# Patient Record
Sex: Male | Born: 1980 | Race: White | Hispanic: No | Marital: Single
Health system: Midwestern US, Community
[De-identification: ages and names within clinical notes are randomized; demographics above are authoritative.]

## PROBLEM LIST (undated history)

## (undated) DIAGNOSIS — Z789 Other specified health status: Secondary | ICD-10-CM

## (undated) HISTORY — PX: ABDOMINAL SURGERY: SHX537

## (undated) HISTORY — PX: NO PAST SURGERIES: SHX2092

---

## 2001-04-11 ENCOUNTER — Inpatient Hospital Stay (HOSPITAL_COMMUNITY): Admission: AD | Admit: 2001-04-11 | Discharge: 2001-04-16 | Payer: Self-pay | Admitting: Psychiatry

## 2004-07-30 ENCOUNTER — Emergency Department (HOSPITAL_COMMUNITY): Admission: EM | Admit: 2004-07-30 | Discharge: 2004-07-30 | Payer: Self-pay | Admitting: Emergency Medicine

## 2007-09-18 ENCOUNTER — Emergency Department (HOSPITAL_COMMUNITY): Admission: EM | Admit: 2007-09-18 | Discharge: 2007-09-18 | Payer: Self-pay | Admitting: Emergency Medicine

## 2011-03-31 ENCOUNTER — Emergency Department (HOSPITAL_COMMUNITY)
Admission: EM | Admit: 2011-03-31 | Discharge: 2011-04-01 | Disposition: A | Discharge status: Home / Self Care | Payer: Medicare Other | Attending: Emergency Medicine | Admitting: Emergency Medicine

## 2011-03-31 DIAGNOSIS — F1910 Other psychoactive substance abuse, uncomplicated: Secondary | ICD-10-CM | POA: Insufficient documentation

## 2011-03-31 DIAGNOSIS — Z046 Encounter for general psychiatric examination, requested by authority: Secondary | ICD-10-CM | POA: Insufficient documentation

## 2011-03-31 LAB — RAPID URINE DRUG SCREEN, HOSP PERFORMED
Benzodiazepines: NOT DETECTED
Cocaine: POSITIVE — AB
Tetrahydrocannabinol: NOT DETECTED

## 2011-03-31 LAB — URINALYSIS, ROUTINE W REFLEX MICROSCOPIC
Nitrite: NEGATIVE
Protein, ur: NEGATIVE mg/dL
Specific Gravity, Urine: >1.03 — ABNORMAL HIGH (ref 1.005–1.030)
Urobilinogen, UA: 0.2 mg/dL (ref 0.0–1.0)

## 2011-04-01 LAB — BASIC METABOLIC PANEL
BUN: 19 mg/dL (ref 6–23)
Chloride: 104 mEq/L (ref 96–112)
GFR calc Af Amer: >60 mL/min (ref >60)
Potassium: 3.6 mEq/L (ref 3.5–5.1)

## 2011-04-01 LAB — DIFFERENTIAL
Basophils Absolute: 0 10*3/uL (ref 0.0–0.1)
Eosinophils Absolute: 0.2 10*3/uL (ref 0.0–0.7)
Eosinophils Relative: 3 % (ref 0–5)
Lymphocytes Relative: 37 % (ref 12–46)
Lymphs Abs: 2.5 10*3/uL (ref 0.7–4.0)
Monocytes Absolute: 0.5 10*3/uL (ref 0.1–1.0)
Monocytes Relative: 7 % (ref 3–12)
Neutrophils Relative %: 53 % (ref 43–77)

## 2011-04-01 LAB — CBC
MCH: 29.8 pg (ref 26.0–34.0)
RBC: 5.06 MIL/uL (ref 4.22–5.81)
RDW: 13.4 % (ref 11.5–15.5)
WBC: 6.7 10*3/uL (ref 4.0–10.5)

## 2011-04-01 LAB — ETHANOL: Alcohol, Ethyl (B): 41 mg/dL — ABNORMAL HIGH (ref 0–10)
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
## 2011-04-22 NOTE — H&P (Signed)
[facility]  Patient:    Victor [name], Victor [name]                      MRN: [id number] Adm. Date:  [id number] Attending:  Geoffery Lyons A Dictator:   Landry Corporal, N.P.                         History and Physical  IDENTIFYING DATA:   Patient was involuntarily committed on Apr 11, 2001, for bipolar disorder, currently experiencing manic symptoms.  REVIEW OF SYSTEMS:  Patient still having some thought disorganization.  Review of systems indicates patient seems to be overall in good health, with some loss of appetite.  Patient does state that he was exposed to poison oak and having some itching to his extremities, and he states he has some bad allergies.  The rest of the review of system is basically noncontributory.  VITAL SIGNS:  Patient states he is 5 feet 10 inches.  He is 185 pounds.  His last vital signs:  97.5, 81 heart rate, 20 respirations, blood pressure is 143/85.  GENERAL APPEARANCE:  Patient is a 30 year old  white male sitting on the exam table in no acute distress.  He is well developed.  Appears his stated age. He is alert and cooperative.  He is dressed in somewhat heavy attire thought.  HEAD:  Normocephalic, he can raise his eyebrows.  EYES:  His pupils are equal and reactive to light.  His EOMs are intact bilaterally.  His funduscopic exam is within normal limits.  His external ear canals are patent.  TMs are intact. His hearing is adequate for normal conversation.  No sinus tenderness.  No nasal discharge.  Mouth mucosa is moist, fair dentition, no lesions were seen. Tongue protrudes midline without tremor.  He can clench his teeth and puff out his cheeks.  There is no pharyngeal exudate.  NECK:  Supple, full range of motion.  No JVD.  Negative lymphadenopathy. Thyroid is nonpalpable and nontender.  Trachea is midline.  CHEST:  Clear to auscultation.  No adventitious sounds.  HEART:  Regular rate and rhythm.  No murmurs, gallops or rubs.   Carotid pulses are equal and adequate bilaterally.  No carotid bruits were auscultated.  ABDOMEN:  Soft, flat, nontender abdomen.  No masses or organomegaly.  Active bowel sounds are present.  There is no CVA tenderness.  MUSCULAR:  No joint swelling or deformity.  Gait is normal.  There is good range of motion.  Muscle strength and tone is equal bilaterally.  There appears to be no signs of any injury.  SKIN:  Warm and dry.  There is a mild, nonpalpable rash present to his wrists. Nail beds are pink with good capillary refill.  NEUROLOGIC:  He is oriented x 3.  His cranial nerves are grossly intact.  Deep tendon reflexes are difficult to elicit.  The patient has very heavy clothing on.  Good grip strength bilaterally.  No involuntary movements.  Gait is normal.  Cerebellar function is intact.  His Romberg is negative.  HEALTH MAINTENANCE issues were addressed. DD:  04/13/01 TD:  04/14/01 Job: [id number]
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
## 2011-10-05 ENCOUNTER — Encounter: Payer: Self-pay | Admitting: *Deleted

## 2011-10-05 DIAGNOSIS — R45850 Homicidal ideations: Secondary | ICD-10-CM | POA: Insufficient documentation

## 2011-10-05 DIAGNOSIS — F319 Bipolar disorder, unspecified: Secondary | ICD-10-CM | POA: Insufficient documentation

## 2011-10-05 LAB — DIFFERENTIAL
Lymphocytes Relative: 30 % (ref 12–46)
Lymphs Abs: 2.5 10*3/uL (ref 0.7–4.0)
Monocytes Relative: 7 % (ref 3–12)
Neutrophils Relative %: 61 % (ref 43–77)

## 2011-10-05 LAB — BASIC METABOLIC PANEL
CO2: 26 mEq/L (ref 19–32)
Calcium: 9.3 mg/dL (ref 8.4–10.5)
Creatinine, Ser: 0.75 mg/dL (ref 0.50–1.35)
GFR calc Af Amer: >90 mL/min (ref >90)
GFR calc non Af Amer: >90 mL/min (ref >90)
Glucose, Bld: 138 mg/dL — ABNORMAL HIGH (ref 70–99)
Sodium: 139 mEq/L (ref 135–145)

## 2011-10-05 LAB — CBC
HCT: 41.9 % (ref 39.0–52.0)
MCH: 29.7 pg (ref 26.0–34.0)
MCHC: 34.8 g/dL (ref 30.0–36.0)
MCV: 85.3 fL (ref 78.0–100.0)
Platelets: 230 10*3/uL (ref 150–400)
RBC: 4.91 MIL/uL (ref 4.22–5.81)

## 2011-10-05 LAB — ETHANOL: Alcohol, Ethyl (B): <11 mg/dL (ref 0–11)

## 2011-10-05 NOTE — ED Notes (Signed)
Pt arrived brought by [facility] [place] SD with IVC paperwork.  Per documentation pt is hearing voices and writing about killing Designer, television/film set.  Pt denies any of this. Alert and cooperative.  Family not available at present time.

## 2011-10-06 ENCOUNTER — Emergency Department (HOSPITAL_COMMUNITY)
Admission: EM | Admit: 2011-10-06 | Discharge: 2011-10-10 | Disposition: A | Discharge status: Other Acute Inpatient Hospital | Payer: Medicare Other | Attending: Emergency Medicine | Admitting: Emergency Medicine

## 2011-10-06 DIAGNOSIS — R45850 Homicidal ideations: Secondary | ICD-10-CM

## 2011-10-06 DIAGNOSIS — F319 Bipolar disorder, unspecified: Secondary | ICD-10-CM

## 2011-10-06 LAB — RAPID URINE DRUG SCREEN, HOSP PERFORMED
Amphetamines: NOT DETECTED
Cocaine: NOT DETECTED
Opiates: NOT DETECTED

## 2011-10-06 MED ORDER — NICOTINE 21 MG/24HR TD PT24: 21.0000 mg | MEDICATED_PATCH | Freq: Every day | TRANSDERMAL | Status: DC | PRN

## 2011-10-06 MED ORDER — ALUM & MAG HYDROXIDE-SIMETH 200-200-20 MG/5ML PO SUSP: 30.0000 mL | ORAL | Status: DC | PRN

## 2011-10-06 MED ORDER — ACETAMINOPHEN 325 MG PO TABS
650.0000 mg | ORAL_TABLET | ORAL | Status: DC | PRN
  Administered 2011-10-09: 650 mg via ORAL

## 2011-10-06 MED ORDER — ZOLPIDEM TARTRATE 5 MG PO TABS: 5.0000 mg | ORAL_TABLET | Freq: Every evening | ORAL | Status: DC | PRN

## 2011-10-06 MED ORDER — IBUPROFEN 400 MG PO TABS: 600.0000 mg | ORAL_TABLET | Freq: Three times a day (TID) | ORAL | Status: DC | PRN

## 2011-10-06 MED ORDER — LORAZEPAM 1 MG PO TABS
1.0000 mg | ORAL_TABLET | Freq: Three times a day (TID) | ORAL | Status: DC | PRN
  Administered 2011-10-06 – 2011-10-09 (×3): 1 mg via ORAL
  Filled 2011-10-06 (×2): qty 1

## 2011-10-06 MED ORDER — ONDANSETRON HCL 4 MG PO TABS: 4.0000 mg | ORAL_TABLET | Freq: Three times a day (TID) | ORAL | Status: DC | PRN

## 2011-10-06 MED ORDER — ZIPRASIDONE MESYLATE 20 MG IM SOLR
20.0000 mg | Freq: Two times a day (BID) | INTRAMUSCULAR | Status: DC | PRN
  Administered 2011-10-06: 20 mg via INTRAMUSCULAR
  Filled 2011-10-06: qty 20
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
## 2011-10-06 NOTE — ED Notes (Signed)
Patient taking shower.  Bedding changed while patient in shower.

## 2011-10-06 NOTE — ED Notes (Signed)
Pt states he needed his hand uncuffed from bed to give urine sample, officer uncuffed hand. Then pt stated he could not give a sample unless he was uncuffed from the bed to stand up. Pt informed that we had to collect urine sample before evaluation could be done. Pt refuses to give urine sample. rcsd remains sitting w/ pt.
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
## 2011-10-06 NOTE — ED Notes (Signed)
Patient offered shower. Police officer accompanied patient to shower and will remain with patient during shower. Patient in NAD.

## 2011-10-06 NOTE — ED Notes (Signed)
Breakfast tray provided to patient. Patient alert/oriented x 4. Sitting in bed watching TV. Law enforcement remains at bedside. Denies any other needs at this time.

## 2011-10-06 NOTE — ED Notes (Signed)
Security wanded pt for weapons.
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
## 2011-10-06 NOTE — ED Notes (Signed)
Patient stating he wants to draw tattoos. Patient provided with crayons and paper to draw. Patient denies any other needs. Law enforcement remains at bedside.
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
## 2011-10-07 NOTE — ED Notes (Signed)
Pt requesting release papers stating he has to get out of here because "I got stuff to do today."  Delay and plan of care explained to pt.  Pt verbalized understanding.  [employer] at bedside.

## 2011-10-07 NOTE — ED Notes (Signed)
Pt requesting to see Victor [name], ACT.  Ella notified and said will see pt as soon as she got her paperwork finished. Pt notified.

## 2011-10-07 NOTE — ED Notes (Signed)
Pt up to bathroom with [employer].  nad noted. 

## 2011-10-07 NOTE — ED Notes (Signed)
Pt lying in bed talking to deputy. No needs voiced at this time.
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
## 2011-10-07 NOTE — ED Notes (Signed)
Pt given a coke and peanut butter crackers.

## 2011-10-07 NOTE — ED Notes (Signed)
Pt watching tv in room in bed no noted distress no stated needs.

## 2011-10-07 NOTE — ED Notes (Signed)
Pt requesting to know who took out IVC papers and when he was going to leave.  Pt also requesting to speak to father on phone.  Status of pt placement explained to pt, pt verbalized understanding.  Pt allowed phone privileges to speak to dad providing pt remains calm during call.  Pt verbalized understanding.  Pt calm, cooperative, nad noted.  [employer] at bedside.

## 2011-10-07 NOTE — ED Notes (Signed)
Pt calm, cooperative, pleasant, ate 100% meal tray.  nad noted.  [employer] at bedside.

## 2011-10-07 NOTE — ED Notes (Signed)
Pt requesting to use the phone and wanted a warm blanket. Pt given a warm blanket and phone.

## 2011-10-07 NOTE — ED Notes (Signed)
Dinner tray given.  nad noted.  [employer] at bedside.

## 2011-10-07 NOTE — ED Notes (Signed)
Lunch tray given.  nad noted.  Pt pleasant, calm, cooperative, watching tv and talking to deputy.  nad noted.

## 2011-10-07 NOTE — ED Notes (Signed)
Pt calm, cooperative at this time.  Breakfast given.  [employer] at bedside.  nad noted.

## 2011-10-07 NOTE — ED Notes (Signed)
Pt calm, watching tv.  [employer] at bedside.  nad noted.

## 2011-10-08 NOTE — ED Provider Notes (Signed)
I have reevaluated the patient at this time and he is awake, alert, and oriented to person, place, time, and event, and in no distress, physically or behaviorally. He is calm, cooperative, and answers my questions appropriately. He denies any needs at this time other than some crackers which I've gotten for him. His breathing is nonlabored and he appears comfortable in bed. Awaiting placement per behavioral health team at this time.  Felisa Bonier, MD 10/08/11 2325

## 2011-10-08 NOTE — ED Notes (Signed)
Pt up to restroom with officer present outside door.

## 2011-10-08 NOTE — ED Notes (Signed)
Pts linens changed 

## 2011-10-08 NOTE — ED Notes (Signed)
Pt is resting. NAD. Remains calm and cooperative. [employer] at bedside.

## 2011-10-08 NOTE — ED Notes (Signed)
Pt asleep, [employer] at bedside

## 2011-10-08 NOTE — ED Notes (Signed)
Pt provided snack. Calm and cooperative at this time. Officer remains at bedside, pt is shackled to bed frame. No further requests at this time.

## 2011-10-08 NOTE — ED Notes (Signed)
Pt accepted to [facility] wait list. Sending a fax documenting pt's aggressive behavior since he has been at this facility. Sending fax to Markus Jarvis [phone].

## 2011-10-08 NOTE — ED Notes (Signed)
Pt took a Technical brewer and staff present. Pt was given new paper scrubs and socks to put on. Pt was cooperative in the shower. He give officer and staff no problems. Pt's linens were changed and pt was given 2 new warm blankets. Pt was also given his lunch tray. Pt presently in shackles, but no handcuffs. NAD at this time.

## 2011-10-08 NOTE — ED Notes (Signed)
Pt requesting to shower. Pt and rcsd taken to shower. Pt given new scrubs, socks,  toothbrush, toothpaste, soap, deodorant, shampoo, towels and washcloths.

## 2011-10-08 NOTE — ED Notes (Signed)
Pt has had meal. [employer] at bedside. Pt has remained calm and cooperative.

## 2011-10-08 NOTE — ED Notes (Signed)
Pt watching television. [employer] remains at bedside. PT has had shower. NAD at this time.

## 2011-10-08 NOTE — ED Notes (Signed)
Pt resting with eyes closed.

## 2011-10-08 NOTE — ED Notes (Signed)
Pt provided crackers & peanut butter at this time. rcsd remains in room w/ pt no other needs voiced at this time.

## 2011-10-09 NOTE — ED Notes (Signed)
Pt resting in bed, even rise and fall of chest, in NAD. Officer remains at bedside. Will continue to monitor. 

## 2011-10-09 NOTE — ED Notes (Signed)
Pt resting in bed, [employer]d remains at bedside.  Offered foods/bathroom, pt declined.

## 2011-10-09 NOTE — ED Notes (Signed)
Pt requested med to help relax, lorazepam given. Officer remains at bedside.

## 2011-10-09 NOTE — ED Notes (Signed)
Pt watching tv in bed with [employer] present at bedside. No requests at this time.

## 2011-10-09 NOTE — ED Provider Notes (Signed)
Pt stable [facility]  Benny Lennert, MD 10/09/11 [phone]

## 2011-10-09 NOTE — ED Provider Notes (Signed)
Pt awake and alert, intently watching tv. Under ivc, security at bedside, awaiting [facility] bed. Vitals normal.   Suzi Roots, MD 10/09/11 2329

## 2011-10-09 NOTE — ED Notes (Signed)
Pt was given a breakfast tray. [employer] present at bedside. Pt is not handcuffed or shackled at this time. NAD noted.

## 2011-10-10 NOTE — ED Notes (Signed)
Pt resting in bed watching TV, No requests at this time.

## 2011-10-10 NOTE — ED Notes (Signed)
Pt remains calm and cooperative. Pt resting in bed, even rise and fall of chest, in NAD. Officer remains at bedside. Will continue to monitor.

## 2011-10-10 NOTE — ED Notes (Signed)
Pt resting in bed, even rise and fall of chest, in NAD. Officer remains at bedside. Will continue to monitor. 

## 2011-10-10 NOTE — ED Notes (Signed)
Pt resting with eyes closed, officer at bedside, breathing even and unlabored, NAD. Will continue to monitor.

## 2011-10-10 NOTE — ED Notes (Signed)
Report to greg oncoming rn 

## 2011-10-10 NOTE — ED Notes (Signed)
Pt has a visitor. Visitor brought the pt a soft cover book to read. Officer remains outside door at this time. Pt remains calm and cooperative. Will continue to monitor visitation.

## 2011-10-10 NOTE — ED Notes (Signed)
Pt asleep in bed, resp even, officer remains at bedside.

## 2011-10-10 NOTE — ED Notes (Signed)
Pt resting in bed, rcsd at bedside
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
## 2011-10-10 NOTE — ED Notes (Signed)
Pt resting watching TV, officer remains at bedside, in NAD. Will continue to monitor.

## 2012-02-22 ENCOUNTER — Emergency Department (HOSPITAL_COMMUNITY)
Admission: EM | Admit: 2012-02-22 | Discharge: 2012-02-22 | Disposition: A | Discharge status: Home / Self Care | Payer: Medicare Other | Attending: Emergency Medicine | Admitting: Emergency Medicine

## 2012-02-22 ENCOUNTER — Encounter (HOSPITAL_COMMUNITY): Payer: Self-pay

## 2012-02-22 DIAGNOSIS — M6282 Rhabdomyolysis: Secondary | ICD-10-CM | POA: Insufficient documentation

## 2012-02-22 DIAGNOSIS — E860 Dehydration: Secondary | ICD-10-CM | POA: Insufficient documentation

## 2012-02-22 LAB — BASIC METABOLIC PANEL
Chloride: 99 mEq/L (ref 96–112)
GFR calc Af Amer: >90 mL/min (ref >90)
Potassium: 3.8 mEq/L (ref 3.5–5.1)
Sodium: 136 mEq/L (ref 135–145)

## 2012-02-22 LAB — URINALYSIS, ROUTINE W REFLEX MICROSCOPIC
Ketones, ur: 15 mg/dL — AB
Leukocytes, UA: NEGATIVE
Nitrite: NEGATIVE
Specific Gravity, Urine: >1.03 — ABNORMAL HIGH (ref 1.005–1.030)
pH: 5.5 (ref 5.0–8.0)

## 2012-02-22 LAB — CK: Total CK: 1414 U/L — ABNORMAL HIGH (ref 7–232)

## 2012-02-22 MED ORDER — KETOROLAC TROMETHAMINE 60 MG/2ML IM SOLN: 60.0000 mg | Freq: Once | INTRAMUSCULAR | Status: DC

## 2012-02-22 MED ORDER — SODIUM CHLORIDE 0.9 % IV BOLUS (SEPSIS): 1000.0000 mL | Freq: Once | INTRAVENOUS | Status: DC

## 2012-02-22 MED ORDER — PROMETHAZINE HCL 25 MG PO TABS: 25.0000 mg | ORAL_TABLET | Freq: Four times a day (QID) | ORAL | Status: AC | PRN

## 2012-02-22 NOTE — ED Notes (Signed)
IV started, labs drawn. Pt declines pain medication at this time.

## 2012-02-22 NOTE — ED Provider Notes (Signed)
History     CSN: [id number]  Arrival date & time 02/22/12  0204   First MD Initiated Contact with Patient 02/22/12 0240      Chief Complaint  Patient presents with  . Dehydration    (Consider location/radiation/quality/duration/timing/severity/associated sxs/prior treatment) HPI Comments: 31 year old male presents with a complaint of cramping all over, thigh cramping, back cramping. He states that this is persistent over the last several days, gradually worsening, not associated with diarrhea though he does have some nausea this evening. He states that he works outside as a Scientist, water quality and has been doing a lot of jogging in the last 24 hours. He is unable to tolerate fluids by mouth. He does have associated headache. These are persistent  The history is provided by the patient and a relative.    History reviewed. No pertinent past medical history.  History reviewed. No pertinent past surgical history.  No family history on file.  History  Substance Use Topics  . Smoking status: Never Smoker   . Smokeless tobacco: Not on file  . Alcohol Use: No      Review of Systems  Constitutional: Negative for fever and chills.  HENT: Negative for sore throat and neck pain.   Eyes: Negative for visual disturbance.  Respiratory: Negative for cough and shortness of breath.   Cardiovascular: Negative for chest pain.  Gastrointestinal: Positive for nausea. Negative for vomiting, abdominal pain and diarrhea.  Genitourinary: Negative for dysuria and frequency.  Musculoskeletal: Positive for myalgias and back pain. Negative for joint swelling and gait problem.  Skin: Negative for rash.  Neurological: Positive for headaches. Negative for weakness and numbness.  Hematological: Negative for adenopathy.  Psychiatric/Behavioral: Negative for behavioral problems.    Allergies  Review of patient's allergies indicates no known allergies.  Home Medications   Current Outpatient Rx  Name Route  Sig Dispense Refill  . PROMETHAZINE HCL 25 MG PO TABS Oral Take 1 tablet (25 mg total) by mouth every 6 (six) hours as needed for nausea. 12 tablet 0    BP 145/91  Pulse 81  Temp(Src) 98.1 F (36.7 C) (Oral)  Resp 18  Ht 5\' 9"  (1.753 m)  Wt 200 lb (90.719 kg)  BMI 29.53 kg/m2  SpO2 100%  Physical Exam  Nursing note and vitals reviewed. Constitutional: He appears well-developed and well-nourished. No distress.  HENT:  Head: Normocephalic and atraumatic.  Mouth/Throat: Oropharynx is clear and moist. No oropharyngeal exudate.  Eyes: Conjunctivae and EOM are normal. Pupils are equal, round, and reactive to light. Right eye exhibits no discharge. Left eye exhibits no discharge. No scleral icterus.  Neck: Normal range of motion. Neck supple. No JVD present. No thyromegaly present.  Cardiovascular: Normal rate, regular rhythm, normal heart sounds and intact distal pulses.  Exam reveals no gallop and no friction rub.   No murmur heard. Pulmonary/Chest: Effort normal and breath sounds normal. No respiratory distress. He has no wheezes. He has no rales.  Abdominal: Soft. Bowel sounds are normal. He exhibits no distension and no mass. There is no tenderness.  Musculoskeletal: Normal range of motion. He exhibits tenderness ( Mild tenderness in the bilateral thigh muscles, lower back. No swelling or edema). He exhibits no edema.  Lymphadenopathy:    He has no cervical adenopathy.  Neurological: He is alert. Coordination normal.  Skin: Skin is warm and dry. No rash noted. No erythema.  Psychiatric: He has a normal mood and affect. His behavior is normal.    ED Course  Procedures (including critical care time)  Labs Reviewed  CK - Abnormal; Notable for the following:    Total CK 1414 (*)    All other components within normal limits  BASIC METABOLIC PANEL - Abnormal; Notable for the following:    Glucose, Bld 114 (*)    BUN 31 (*)    All other components within normal limits  URINALYSIS,  ROUTINE W REFLEX MICROSCOPIC - Abnormal; Notable for the following:    Specific Gravity, Urine >1.030 (*)    Ketones, ur 15 (*)    All other components within normal limits   No results found.   1. Rhabdomyolysis   2. Dehydration       MDM  If his membranes moist, vital signs normal, has tenderness in his muscles. Rule out rhabdomyolysis, renal dysfunction dehydration and urinary infection. IV fluid bolus, intra-venous Toradol   Laboratory results reviewed and show that he has a creatinine kinase of 1400, normal renal function and a urinalysis that shows dehydration with high specific gravity and 15 ketones. He has been given 2 L of IV fluids, is able to take oral fluids and is amenable to discharge. I have encouraged him to followup closely for a recheck of his creatine kinase, if needed to come to the emergency department for this. He has expressed his understanding      Vida Roller, MD 02/22/12 [phone]

## 2012-02-22 NOTE — ED Notes (Signed)
Pt reports that he is feeling dehydrated.  States he is having generalized cramps and some numbness.  Pt denies n/v/d.  Denies problems with tolerating p.o fluids.

## 2012-02-22 NOTE — Discharge Instructions (Signed)
You must return to the ER for a recheck of your blood test called "CK" in the next 1-2 days if you can't be seen by your family doctor for a recheck.  Return to ER for severe or worsening pain, vomiting, soreness, fevers.  RESOURCE GUIDE  Dental Problems  Patients with Medicaid: [facility] [phone] [address]                                           [phone] [address] Phone:  [phone]                                                  Phone:  [phone]  If unable to pay or uninsured, contact:  [facility] or [facility] to become qualified for the adult dental clinic.  Chronic Pain Problems Contact [facility]  [phone] Patients need to be referred by their primary care doctor.  Insufficient Money for Medicine Contact United Way:  call "211" or [facility] [phone].  No Primary Care Doctor Call [facility]  [phone] Other agencies that provide inexpensive medical care    [facility]  [phone]    [facility]  [phone]    [facility]  [phone]    [facility]  [phone]    [facility]  [phone]    [facility]  [phone]  Psychological Services [facility]  [phone] [facility] Services  [phone] [facility]   [phone] (emergency services [phone])  Substance Abuse Resources [facility]  [phone] [facility] [phone] [facility] [phone] Floydene Flock [phone] Residential & Outpatient Substance Abuse Program  [phone]  Abuse/Neglect [facility] Child Abuse Hotline 445-040-7072 [facility] Child Abuse Hotline (208) 533-8063 (After Hours)  Emergency Shelter [facility] [phone]  Maternity Homes [facility] [phone] [facility] [phone]  MRSA Hotline  #:   352-512-8167    [facility] Resources  [facility]     [employer]                          [facility] Dept. [address] [place]                       [address]      [address]  [place]                                                Cristobal Goldmann Phone:  [phone]  Phone:  [phone]                 Phone:  [phone]  [place] [facility] Phone:  [phone]  [place] Child Abuse Hotline (336) 342-1394 [phone]) [phone] (After Hours)   

## 2012-02-22 NOTE — ED Notes (Signed)
Pt states he feels that he is dehydrated, states is cramping all over, denies n/v/d

## 2012-05-05 ENCOUNTER — Emergency Department (HOSPITAL_COMMUNITY)
Admission: EM | Admit: 2012-05-05 | Discharge: 2012-05-05 | Discharge status: Against Medical Advice | Payer: Medicare Other | Attending: Emergency Medicine | Admitting: Emergency Medicine

## 2012-05-05 ENCOUNTER — Encounter (HOSPITAL_COMMUNITY): Payer: Self-pay | Admitting: Emergency Medicine

## 2012-05-05 DIAGNOSIS — Z5321 Procedure and treatment not carried out due to patient leaving prior to being seen by health care provider: Secondary | ICD-10-CM

## 2012-05-05 DIAGNOSIS — Z0389 Encounter for observation for other suspected diseases and conditions ruled out: Secondary | ICD-10-CM | POA: Insufficient documentation
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
## 2012-05-05 NOTE — ED Notes (Signed)
Pt c/o chest congestion with productive cough.

## 2012-05-05 NOTE — ED Provider Notes (Cosign Needed)
History     CSN: [id number]  Arrival date & time 05/05/12  1158   First MD Initiated Contact with Patient 05/05/12 1248      Chief Complaint  Patient presents with  . Shortness of Breath    (Consider location/radiation/quality/duration/timing/severity/associated sxs/prior treatment) HPI  History reviewed. No pertinent past medical history.  History reviewed. No pertinent past surgical history.  History reviewed. No pertinent family history.  History  Substance Use Topics  . Smoking status: Never Smoker   . Smokeless tobacco: Not on file  . Alcohol Use: Yes      Review of Systems  Allergies  Review of patient's allergies indicates no known allergies.  Home Medications  No current outpatient prescriptions on file.  BP 121/69  Pulse 89  Temp(Src) 98.5 F (36.9 C) (Oral)  Resp 20  Ht 5\' 9"  (1.753 m)  Wt 195 lb (88.451 kg)  BMI 28.80 kg/m2  SpO2 100%  Physical Exam  ED Course  Procedures (including critical care time)  2:12 PM Pt left without being seen.  1. Patient left without being seen           Carleene Cooper III, MD 05/05/12 1414
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
## 2012-06-02 ENCOUNTER — Emergency Department (HOSPITAL_COMMUNITY)
Admission: EM | Admit: 2012-06-02 | Discharge: 2012-06-02 | Disposition: A | Discharge status: Home / Self Care | Payer: No Typology Code available for payment source | Attending: Emergency Medicine | Admitting: Emergency Medicine

## 2012-06-02 ENCOUNTER — Emergency Department (HOSPITAL_COMMUNITY): Payer: No Typology Code available for payment source

## 2012-06-02 ENCOUNTER — Encounter (HOSPITAL_COMMUNITY): Payer: Self-pay

## 2012-06-02 DIAGNOSIS — M542 Cervicalgia: Secondary | ICD-10-CM | POA: Insufficient documentation

## 2012-06-02 DIAGNOSIS — Y9389 Activity, other specified: Secondary | ICD-10-CM | POA: Insufficient documentation

## 2012-06-02 DIAGNOSIS — S161XXA Strain of muscle, fascia and tendon at neck level, initial encounter: Secondary | ICD-10-CM

## 2012-06-02 DIAGNOSIS — Y92410 Unspecified street and highway as the place of occurrence of the external cause: Secondary | ICD-10-CM | POA: Insufficient documentation

## 2012-06-02 DIAGNOSIS — Y999 Unspecified external cause status: Secondary | ICD-10-CM | POA: Insufficient documentation

## 2012-06-02 DIAGNOSIS — T148XXA Other injury of unspecified body region, initial encounter: Secondary | ICD-10-CM

## 2012-06-02 DIAGNOSIS — IMO0002 Reserved for concepts with insufficient information to code with codable children: Secondary | ICD-10-CM | POA: Insufficient documentation

## 2012-06-02 MED ORDER — HYDROCODONE-ACETAMINOPHEN 5-325 MG PO TABS
2.0000 | ORAL_TABLET | Freq: Once | ORAL | Status: DC
  Filled 2012-06-02: qty 2

## 2012-06-02 MED ORDER — IBUPROFEN 600 MG PO TABS: 600.0000 mg | ORAL_TABLET | Freq: Three times a day (TID) | ORAL | Status: AC | PRN

## 2012-06-02 MED ORDER — TETANUS-DIPHTH-ACELL PERTUSSIS 5-2.5-18.5 LF-MCG/0.5 IM SUSP
0.5000 mL | Freq: Once | INTRAMUSCULAR | Status: DC
  Filled 2012-06-02: qty 0.5
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
## 2012-06-02 NOTE — ED Notes (Signed)
c-collar placed in triage

## 2012-06-02 NOTE — ED Notes (Signed)
Pt refused Tetanus shot.  °
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
## 2012-06-02 NOTE — ED Notes (Addendum)
Pt refusing hydrocodone stating, " I dont want that. Just tell him to give me a THC pill. That will take care of my pain"

## 2014-01-19 ENCOUNTER — Inpatient Hospital Stay
Admit: 2014-01-19 | Discharge: 2014-01-21 | Disposition: A | Discharge status: Home / Self Care | Payer: MEDICARE | Attending: Forensic Psychiatry | Admitting: Forensic Psychiatry

## 2014-01-19 DIAGNOSIS — F3112 Bipolar disorder, current episode manic without psychotic features, moderate: Secondary | ICD-10-CM

## 2014-01-19 MED ORDER — LORAZEPAM 1 MG TAB
1 mg | ORAL | Status: AC
Start: 2014-01-19 — End: 2014-01-19
  Administered 2014-01-19: 07:00:00 via ORAL

## 2014-01-19 MED ORDER — HALOPERIDOL 5 MG TAB
5 mg | ORAL | Status: AC
Start: 2014-01-19 — End: 2014-01-19
  Administered 2014-01-19: 07:00:00 via ORAL

## 2014-01-19 MED ADMIN — LORazepam (ATIVAN) tablet 2 mg: ORAL | @ 17:00:00 | NDC 63739050010

## 2014-01-19 MED ADMIN — haloperidol (HALDOL) tablet 5 mg: ORAL | @ 17:00:00 | NDC 00781139613

## 2014-01-19 MED FILL — HALOPERIDOL 5 MG TAB: 5 mg | ORAL | Qty: 1

## 2014-01-19 MED FILL — HYDROXYZINE 50 MG TAB: 50 mg | ORAL | Qty: 1

## 2014-01-19 MED FILL — LORAZEPAM 1 MG TAB: 1 mg | ORAL | Qty: 2

## 2014-01-19 NOTE — ED Notes (Signed)
Security present for 1:1 observation. Pt offers no complaints, pt aggravated that he was sent here for eval. Pt maintains that he has done nothing wrong, that he's threatened no one, and is not a danger to himself or anyone else

## 2014-01-19 NOTE — Behavioral Health Treatment Team (Signed)
Pt did not take dinner, he has been self isolating in his room, no further outbursts. Chilton GreathouseKathleen Guerra, RN

## 2014-01-19 NOTE — Behavioral Health Treatment Team (Signed)
Pt requesting something more for sleep, given Seroquel 100mg , this writer had offered him Ativan, due to his tense appearance, he said "fuck the ativan I want the seroquel!"  He was polite after he was given the PRN medication intervention.

## 2014-01-19 NOTE — ED Notes (Signed)
Pt to be admitted to BHU

## 2014-01-19 NOTE — ED Notes (Signed)
Pt medicated as ordered, pt tolerated well

## 2014-01-19 NOTE — Behavioral Health Treatment Team (Signed)
Lori Regan, RN Registered Nurse Signed  BH Notes 01/19/14 0638      Expand All Collapse All   SHIFT CHANGE REPORT    Verbal Shift report given to: Kathy Guerra, RN and Pat Kowalczyk, RN    ,   using SBAR, Doc Flowsheets, Kardex, MAR and Notes.    By: Lori Regan, RN

## 2014-01-19 NOTE — Behavioral Health Treatment Team (Signed)
SHIFT CHANGE REPORT    Verbal Shift report given to:   C. Bliefernich RN and J. Blume, RN  By: Kathleen Guerra, RN

## 2014-01-19 NOTE — ED Notes (Signed)
Pt aware of admission

## 2014-01-19 NOTE — Behavioral Health Treatment Team (Addendum)
Patient very paranoid,  Does not want to answer questions.   Uses foul words, especially racial  Labile, irritable and angry,  Gets threatening verbal,  After a short interview he left the room, pushing the chair towards the table very hard.  Fortunately he has been willing to take PO PRN med's, Haldol and Ativan, He did not want any regular med's.  There is information from his family in [place] about his background information.  Has chronic schizophrenia with potential to act out. Has shown poor med compliance  Present very poor insight and judgment.  Admitted as 9.39 case.  Needs continued inpatient treatment.  9.39 update done.
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
## 2014-01-19 NOTE — ED Notes (Signed)
SW Jess at the bedside

## 2014-01-19 NOTE — Behavioral Health Treatment Team (Signed)
Pt met with Dr. Larena Sox, MHT and this writer to gather information regarding events leading to hospitalization. He presents with mood lability and irritability. Foul, derogatory language toward team, no insight into illness. He does not belong here, and should be let go. When asked about prior MH hospitalizations he said what does that have to do with anything, can't a person move and go elsewhere and start over?  Poor historian, easily agitated. Left meeting abruptly, banging door into chair.   Offered prn by med nurse, said yes than changed his mind saying the medication "Fu--s me up". MHT Barnabas Lister attempting to establish trusting rapport with pt. Continue with supportive therapies. Baxter Hire, RN
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
## 2014-01-19 NOTE — H&P (Signed)
This 33 yr old white single male is from [place].  It appears that he had been on the move and lately he was in [place] , working as a Corporate investment bankerconstruction worker.  His boss called [place] police when Vinetta BergamoCharley was reportedly getting upset with him    He was noted to be very angry, bizarre, and very delusional upon intake. He was making racial comments using four letter words often.    There is some information on his background , the salient notes going back to 2000. 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  He has shown explosive behaviors, verbal and physical aggression.   He has travelled and been to different psyche hospital , involuntarily committed.   Family is supportive and involved to get him some help.  Urine positive for marijuana.It seems he has no intent to harm self but if not in a structured environment, without med's  he could very well be a danger to others,    He does not want to tal He gets very angry and wants to leave.   Pleas e my progress note from today's direct contact with him.  He does not want to med. Fortunately will take PRN by mouth, with some persuasion.    Dx  Axis ! Schizoaffective Disorder , Bipolar Type.  Axis 11 Deferred,  Axis 111 NKA  Denies medical issues  Axis !V  Poor insight  And judgement. Poor treatment compliance.  Axis  V  Current GAF 20.    Additional information could be obtained from his family.  Fortunately he is taking PRN med's which are Haldol for paranoia, and Ativan for anxiety and [facility] and he was medically cleared before [facility]

## 2014-01-19 NOTE — ED Provider Notes (Signed)
Patient is a 33 y.o. male presenting with mental health disorder. The history is provided by the patient and medical records.   Mental Health Problem  The primary symptoms do not include dysphoric mood.   Additional symptoms of the illness do not include no agitation.    Patient was transferred from [facility] hospital.  As per report, patient has history of schizophrenia, not on meds, acting bizarre and threatening co-workers.  Patient denies any history of schizophrenia, bipolar, or other mental illness.  Patient does report problem with crack cocaine in the past, denies current use.  As per patient, he is working on a Holiday representative site, came up from [employer] [place] to earn some money and is trying to earn enough to bring his girlfriend from [place] up here.    Patient reports that he had some sort of disagreement with his boss or co-workers which resulted in police being called.  Patient believes that his boss or co-worker called police.  Police report [facility] by [facility] indicates that patient's mother called police and reported history of bipolar and schizophrenia.  When asked, patient states he's not sure why his mother would say that, but that he wishes that his parents would just die of natural causes so they will leave him alone.  Patient denies assaulting anyone, denies suicidal/homicidal thoughts.  States he just wants to be left alone.        History reviewed. No pertinent past medical history.     History reviewed. No pertinent past surgical history.      History reviewed. No pertinent family history.     History     Social History   ??? Marital Status: SINGLE     Spouse Name: N/A     Number of Children: N/A   ??? Years of Education: N/A     Occupational History   ??? Not on file.     Social History Main Topics   ??? Smoking status: Current Every Day Smoker   ??? Smokeless tobacco: Not on file   ??? Alcohol Use: Yes   ??? Drug Use: Yes     Special: Marijuana   ??? Sexual Activity: Not on file     Other Topics Concern   ??? Not  on file     Social History Narrative   ??? No narrative on file                  ALLERGIES: Review of patient's allergies indicates no known allergies.      Review of Systems   Constitutional: Negative for fever and chills.   Respiratory: Negative.    Cardiovascular: Negative.    Gastrointestinal: Negative.    Neurological: Negative for dizziness and light-headedness.   Psychiatric/Behavioral: Negative for suicidal ideas, sleep disturbance, self-injury, dysphoric mood and agitation. The patient is not nervous/anxious.    All other systems reviewed and are negative.      Filed Vitals:    01/19/14 0023   BP: 132/74   Pulse: 76   Temp: 98.1 ??F (36.7 ??C)   Resp: 20   Height: 5\' 9"  (1.753 m)   Weight: 78.472 kg (173 lb)   SpO2: 100%            Physical Exam   Constitutional: He is oriented to person, place, and time. He appears well-developed and well-nourished.   HENT:   Head: Normocephalic and atraumatic.   Mouth/Throat: Oropharynx is clear and moist.   Eyes: Conjunctivae and EOM are normal.  Pupils are equal, round, and reactive to light.   Neck: Normal range of motion. Neck supple.   Cardiovascular: Normal rate, regular rhythm and normal heart sounds.  Exam reveals no gallop and no friction rub.    No murmur heard.  Pulmonary/Chest: Effort normal and breath sounds normal. No respiratory distress. He has no wheezes. He has no rales.   Abdominal: Soft. He exhibits no distension and no mass. There is no tenderness. There is no rebound and no guarding.   Musculoskeletal: Normal range of motion. He exhibits no edema or tenderness.   Neurological: He is alert and oriented to person, place, and time. He has normal strength. He displays no tremor. Coordination and gait normal.   Skin: Skin is warm and dry. No rash noted.   Psychiatric: He has a normal mood and affect. His speech is normal and behavior is normal. Judgment normal. He is not actively hallucinating. Thought content is not paranoid. He expresses no homicidal and no  suicidal ideation.   Patient exhibits tangential thoughts, but no flight of ideas.       Nursing note and vitals reviewed.       MDM    Procedures      [facility], which include CBC, BMP, LFTs, UA - all normal.  TSH is slightly low.  EtOH is negative.  UDS shows positive only for cannabinoids.  Information provided by patient contradicts information from police and ED.  Patient's speech is not "incoherent" or "illogical" and patient does speak clearly.  He is not "argumentative" or "belligerent" here.  Patient does not have flight of ideas at this time.    The patient's medical screening exam is complete and the patient is medically clear for evaluation by mental health/detox screener.    1:31 AM  Patient seen by psych screener.  She spoke with patient's mom on the phone and received further information - mom sent a 7 page history describing patient's prior psych history.  Apparently, patient's friend who works at [employer] construction site had called the mother to alert her about patient's strange behavior there.  Apparently, patient has had 6 prior major psychotic episodes, most of which resulted in psychiatric hospitalization.  Mother's history details all the institutions where he was previously hospitalized and names of treating doctors.    When interviewed by psych screener, patient became very aggressive and belligerent.  Calmed down when she left the room.    Given additional information and history of psychosis with no current medication, I would suggest involuntary detention for observation at this time.    Screener spoke with Dr. Donah DriverSalgunan who agrees to psych admit.    <EMERGENCY DEPARTMENT CASE SUMMARY>    Plan: Admit to psych    ED Course: as noted above    Final Impression/Diagnosis: Psychosis, NOS.      Patient condition at time of disposition: stable      I have reviewed the following home medications:    Prior to Admission medications    Not on File         Leitha SchullerMatthew Yaqub Arney, MD
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
## 2014-01-19 NOTE — Other (Signed)
TRANSFER - OUT REPORT:    Telephone report given to Jacki ConesLaurie, RN(name) on Terry KilCharley Hartman  being transferred to MHU(unit) for routine progression of care       Report consisted of patient???s Situation, Background, Assessment and   Recommendations(SBAR).     Information from the following report(s) SBAR, ED Summary, [facility] and Recent Results was reviewed with the receiving nurse.    Opportunity for questions and clarification was provided.

## 2014-01-19 NOTE — Behavioral Health Treatment Team (Signed)
Patient isolative in room, remaining in bed . Requested and received haldol 5 mg po and vistaril 50 mg po @ 2154 for insomnia. Pt had no verbal outbursts at this time and returned to his room and is currently sleeping.

## 2014-01-20 MED ADMIN — haloperidol (HALDOL) tablet 5 mg: ORAL | @ 03:00:00 | NDC 00781139613

## 2014-01-20 MED ADMIN — hydrOXYzine (ATARAX) tablet 50 mg: ORAL | @ 03:00:00 | NDC 51079081601

## 2014-01-20 MED ADMIN — QUEtiapine (SEROquel) tablet 100 mg: ORAL | @ 05:00:00 | NDC 00904627961

## 2014-01-20 MED FILL — HALOPERIDOL 5 MG TAB: 5 mg | ORAL | Qty: 1

## 2014-01-20 MED FILL — HYDROXYZINE 50 MG TAB: 50 mg | ORAL | Qty: 1

## 2014-01-20 MED FILL — QUETIAPINE 100 MG TAB: 100 mg | ORAL | Qty: 1
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
## 2014-01-20 NOTE — Behavioral Health Treatment Team (Signed)
SHIFT CHANGE REPORT    Verbal Shift report given to: Brian O'Donohue, RN, Scott Ryerson, RN  using SBAR, Doc Flowsheets, Kardex, MAR and Notes.  Time for questioning given    By: Jeanne Blume, RN

## 2014-01-21 MED ORDER — QUETIAPINE 50 MG TAB
50 mg | ORAL_TABLET | Freq: Every evening | ORAL | Status: AC | PRN
Start: 2014-01-21 — End: 2014-02-20

## 2014-01-21 MED ORDER — QUETIAPINE 50 MG TAB
50 mg | Freq: Every evening | ORAL | Status: DC | PRN
Start: 2014-01-21 — End: 2014-01-21
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
## 2014-01-21 NOTE — Other (Signed)
Terry Hartman  [address]  [place] [address]  DOB: May 02, 1981  SSN: [id number]  [employer] A + B: [id number]    Emergency Contact: Renard Hamper [phone]    Reason for Admission: Was not completed due to pt being discharged.     Previous Psychiatric Hospitalizations:     HISTORY OF OUTPATIENT PSYCHIATRIC TREATMENT:  FACILITY DATES CONTACT PERSON / PHONE                              HISTORY OF ABUSE / TRAUMA (Include any treatment history):              Sexual:      Physical:      Emotional:     Other:        FAMILY OF ORIGIN:    HISTORY OF RELATIONSHIPS (Dates of Marriage / Divorce, including domestic partner relationships):     ________________________________________________________________________  Children: Name of Child Age Sex With whom does s/he live? Any CPS custody / involvement  ie: visitation, supervision, etc.                                  DEVELOPMENT / CHILDHOOD EVENTS:            NORMAL DEVELOPMENTAL MILESTONES (yes/no):        EDUCATION: (put "x" next to appropriate answer):     Grade School (Grade:    High School (Grade:               Diploma           GED       College  (Degree: None / Chief Operating Officer / Masters / Corporate investment banker       Other:     MILITARY HISTORY:  ?No     ? Yes            BRANCH                                                      Years:   Did you serve in combat?    ?No       ? Yes        Discharge Type?   Did you ever use drugs during your service?  ?No   ? Yes Which?     Were there disciplinary actions taken?         EMPLOYMENT / DAILY ACTIVITIES PATTERNS:              Employment Status:     Previous employment (types of jobs, Occupational hygienist, reasons for termination):     Future career/vocational goals:        SUBSTANCE ABUSE HISTORY (Specify Route):  TYPE OF DRUG AGE OF 1ST USE AMOUNT & FREQUENCY DATE/AMOUNT OF LAST USE   ALCOHOL      COCAINE/CRACK      HEROIN/OPIATES      MARIJUANA      AMPHETAMINES/METHAMPHETAMINES       HALLUCINOGENS      BENZODIAZEPINES      HYPNOTICS          ALCOHOL/SUBSTANCE ABUSE TREATMENT PROGRAMS ATTENDED WITHIN  THE PAST THREE YEARS:  Program Name Type of Program Dates Attended Completed Yes/No    ? Inpatient       ?  Outpatient  ?  Detox     ?  Other ____________        ? Inpatient       ?  Outpatient  ?  Detox     ?  Other ____________      ? Inpatient       ?  Outpatient  ?  Detox     ?  Other ____________      ? Inpatient       ?  Outpatient  ?  Detox     ?  Other ____________             LEGAL HISTORY:      Types of charges and periods of incarceration:     Pending Charge: (Type):     Next Court Date:     ASSISTED OUTPATIENT TREATMENT (AOT):    Does the patient have a court ordered AOT under [place] Law?  (Date of Court Order):     Petitioner:        MEANS OF SUPPORT:(put "x" next to appropriate answer)      Employment                    Unemployment Systems analystBenefits Public Assistance         Retirement                               Pension                                     Veteran???s Benefits      Social Security Disability  (SSDI)  Reason:         Amount:  $                                 Supplemental Security Income (SSI) Reason:  Amount: $                                           None  Other:     RACE / ETHNICITY / CULTURE:    Race: (put "x" next to appropriate answer)        Caucasian  Latino/Hispanic  Native Medical illustratorAmerican       Black(Non-Hispanic)    Asian  Pacific Islander  Other:     Ethnicity:  What is your ethnic background?:      Cultural:  Are there any cultural issues (ie: Language, Customs, feeling a part of/or feeling different and uncomfortable, Diet, etc.) that might impact your treatment here in either a positive or negative way?:       PRIMARY LANGUAGE:  (put "x" next to appropriate answer)       English         Other:       English Proficiency:      Good    Fair        Poor        SPIRITUALITY:  Are you affiliated with any specific religion / spiritual  base?       Do you have any spiritual  and /or religious practices / routines we should know about in order to plan your care and respect your practices?       Clinical Assessment of patient:    What does the patient see as their strengths:      What does the patient see as their limitations:      COMMUNITY CONTACTS:         AGENCY NAME/TITLE PHONE                         Additional Comments:    DISCHARGE PLANNING:    Mental Health / Addictions Treatment:      Housing:     Transportation:     Financial:     Work / School:     Legal:

## 2014-01-21 NOTE — Progress Notes (Signed)
Problem: Falls - Risk of  Goal: *Absence of falls  Outcome: Progressing Towards Goal  Patient remains free from falls this hospitalization.  Continue to monitor.

## 2014-01-21 NOTE — Behavioral Health Treatment Team (Signed)
Terry BergamoCharley makes his needs known, pleasant, no outbusts. Continue with supportive therapies. Terry GreathouseKathleen Guerra, RN

## 2014-01-29 ENCOUNTER — Encounter (HOSPITAL_COMMUNITY): Payer: Self-pay | Admitting: Emergency Medicine

## 2014-01-29 ENCOUNTER — Emergency Department (HOSPITAL_COMMUNITY)
Admission: EM | Admit: 2014-01-29 | Discharge: 2014-02-01 | Disposition: A | Discharge status: Home / Self Care | Payer: Medicare Other | Attending: Emergency Medicine | Admitting: Emergency Medicine

## 2014-01-29 DIAGNOSIS — F17200 Nicotine dependence, unspecified, uncomplicated: Secondary | ICD-10-CM | POA: Insufficient documentation

## 2014-01-29 DIAGNOSIS — F22 Delusional disorders: Secondary | ICD-10-CM | POA: Insufficient documentation

## 2014-01-29 DIAGNOSIS — F319 Bipolar disorder, unspecified: Secondary | ICD-10-CM | POA: Insufficient documentation

## 2014-01-29 DIAGNOSIS — F1220 Cannabis dependence, uncomplicated: Secondary | ICD-10-CM | POA: Insufficient documentation

## 2014-01-29 HISTORY — DX: Other specified health status: Z78.9

## 2014-01-29 LAB — ETHANOL: Alcohol, Ethyl (B): <11 mg/dL (ref 0–11)

## 2014-01-29 LAB — RAPID URINE DRUG SCREEN, HOSP PERFORMED
AMPHETAMINES: NOT DETECTED
BARBITURATES: NOT DETECTED
Benzodiazepines: NOT DETECTED
Cocaine: NOT DETECTED
OPIATES: NOT DETECTED
TETRAHYDROCANNABINOL: POSITIVE — AB

## 2014-01-29 MED ORDER — QUETIAPINE FUMARATE 25 MG PO TABS
50.0000 mg | ORAL_TABLET | Freq: Every evening | ORAL | Status: DC | PRN
  Administered 2014-01-30: 50 mg via ORAL
  Filled 2014-01-29: qty 2
  Filled 2014-01-29: qty 1
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
## 2014-01-29 NOTE — ED Notes (Signed)
Per Behavioral Health: Will be seeking placement for this patient.
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
## 2014-01-30 MED ORDER — QUETIAPINE FUMARATE 25 MG PO TABS
ORAL_TABLET | ORAL | Status: AC
  Filled 2014-01-30: qty 2

## 2014-01-30 NOTE — ED Notes (Signed)
Security at bedside at this time.
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
## 2014-01-30 NOTE — BHH Counselor (Addendum)
[facility]. Writer faxed pt's referral. Kallie Locksnee Greg RN at [facility] faxes IVC to writer, Clinical research associatewriter will fax IVC paperwork to [facility] for review.   Evette Cristalaroline Paige Gargi Berch, [place] Assessment Counselor

## 2014-01-30 NOTE — ED Notes (Signed)
[facility] remains w/ pt at this time.
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
## 2014-01-30 NOTE — ED Provider Notes (Signed)
Patient seen and evaluated for paranoia, aggressive behavior, homicidality towards his parents. He is awaiting placement. No overnight complaints or problems per nursing staff.  Filed Vitals:   01/30/14 0618  BP: 128/78  Pulse: 71  Temp: 97.7 F (36.5 C)  Resp: 16     Gilda Creasehristopher J. Pollina, MD 01/30/14 [phone]
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
## 2014-01-31 DIAGNOSIS — F22 Delusional disorders: Secondary | ICD-10-CM

## 2014-01-31 DIAGNOSIS — F3110 Bipolar disorder, current episode manic without psychotic features, unspecified: Secondary | ICD-10-CM

## 2014-01-31 LAB — COMPREHENSIVE METABOLIC PANEL
ALK PHOS: 82 U/L (ref 39–117)
ALT: 18 U/L (ref 0–53)
AST: 15 U/L (ref 0–37)
Albumin: 4 g/dL (ref 3.5–5.2)
BUN: 18 mg/dL (ref 6–23)
CO2: 27 meq/L (ref 19–32)
Calcium: 9.5 mg/dL (ref 8.4–10.5)
Chloride: 99 mEq/L (ref 96–112)
Creatinine, Ser: 0.71 mg/dL (ref 0.50–1.35)
GFR calc Af Amer: >90 mL/min (ref >90)
GFR calc non Af Amer: >90 mL/min (ref >90)
Glucose, Bld: 104 mg/dL — ABNORMAL HIGH (ref 70–99)
Potassium: 4.5 mEq/L (ref 3.7–5.3)
SODIUM: 138 meq/L (ref 137–147)
Total Bilirubin: 0.4 mg/dL (ref 0.3–1.2)
Total Protein: 7.4 g/dL (ref 6.0–8.3)

## 2014-01-31 LAB — CBC WITH DIFFERENTIAL/PLATELET
Basophils Absolute: 0 10*3/uL (ref 0.0–0.1)
Basophils Relative: 0 % (ref 0–1)
Eosinophils Absolute: 0.1 10*3/uL (ref 0.0–0.7)
Eosinophils Relative: 1 % (ref 0–5)
HCT: 41.7 % (ref 39.0–52.0)
Hemoglobin: 13.8 g/dL (ref 13.0–17.0)
LYMPHS ABS: 1.6 10*3/uL (ref 0.7–4.0)
LYMPHS PCT: 17 % (ref 12–46)
MCH: 29.1 pg (ref 26.0–34.0)
MCHC: 33.1 g/dL (ref 30.0–36.0)
MCV: 88 fL (ref 78.0–100.0)
Monocytes Absolute: 0.7 10*3/uL (ref 0.1–1.0)
Monocytes Relative: 7 % (ref 3–12)
NEUTROS PCT: 74 % (ref 43–77)
Neutro Abs: 6.7 10*3/uL (ref 1.7–7.7)
PLATELETS: 210 10*3/uL (ref 150–400)
RBC: 4.74 MIL/uL (ref 4.22–5.81)
RDW: 13 % (ref 11.5–15.5)
WBC: 9.1 10*3/uL (ref 4.0–10.5)

## 2014-01-31 MED ORDER — QUETIAPINE FUMARATE 100 MG PO TABS: 100.0000 mg | ORAL_TABLET | Freq: Every day | ORAL | Status: DC

## 2014-01-31 NOTE — ED Notes (Signed)
Pt asleep in the dayroom on the couch. No s/s of distress noted.

## 2014-01-31 NOTE — ED Provider Notes (Signed)
Psychiatry team [facility]. Spoke with Dr Manus Gunningancour, accepts in transfer  Lyanne CoKevin M Kylah Maresh, MD 01/31/14 [phone]

## 2014-01-31 NOTE — BHH Counselor (Signed)
[facility] referral is in process.

## 2014-01-31 NOTE — ED Notes (Signed)
Dr Patria Manecampos talked with Dr [facility]

## 2014-01-31 NOTE — Progress Notes (Signed)
Per TTS recommendation, [facility].  Junious Dresseronnie, RN stated [facility].  Blain PaisMichelle L Taunia Frasco, MHT/NS

## 2014-01-31 NOTE — ED Notes (Signed)
Pt resting calmly w/ eyes closed. Rise & fall of the chest noted. [facility] in hallway. Bed in low position, side rails up x2. NAD noted at this time.

## 2014-01-31 NOTE — Progress Notes (Addendum)
MHT called into [facility] and received FAOZ#[id number], good until 02/07/14.  Blain PaisMichelle L Keelin Neville, MHT/NS
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
## 2014-01-31 NOTE — ED Notes (Signed)
Pt resting calmly w/ eyes closed. Rise & fall of the chest noted. [facility] remains w/ pt. Bed in low position, side rails up x2. NAD noted at this time.

## 2014-01-31 NOTE — ED Notes (Signed)
Pt now leaving with [facility]

## 2014-01-31 NOTE — Progress Notes (Signed)
Per Junious Dresseronnie, [facility] at [facility], pt has been [facility] to the waitlist.  Blain PaisMichelle L Jonnatan Hanners, MHT/NS
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
## 2014-01-31 NOTE — ED Notes (Signed)
Patient reported wallet,debit cards, keys and unknown amount of cash missing from checked belongings.  Security contacts [facility] which located items.  Informed patient's mother of item location.

## 2014-01-31 NOTE — ED Notes (Signed)
[facility] and [facility].

## 2014-02-01 NOTE — Progress Notes (Signed)
[id number] [facility]Steve [facility] on their [facility], will call when bed is available.   Victor BambergerMariya Mekayla Soman Disposition MHT

## 2014-02-01 NOTE — [facility] Suicide Risk Assessment (Cosign Needed)
Suicide Risk Assessment  Discharge Assessment     Demographic Factors:  Male and Adolescent or young adult  Total Time spent with patient: 15 minutes  Psychiatric Specialty Exam:     Blood pressure 143/81, pulse 77, temperature 97.9 F (36.6 C), temperature source Oral, resp. rate 16, height 5\' 9"  (1.753 m), weight 81.647 kg (180 lb), SpO2 97.00%.Body mass index is 26.57 kg/(m^2).  General Appearance: Casual  Eye Contact::  Good  Speech:  Clear and Coherent and Normal Rate  Volume:  Normal  Mood:  Euthymic and calm and cooperative  Affect:  Appropriate and Congruent  Thought Process:  Coherent  Orientation:  Full (Time, Place, and Person)  Thought Content:  NA  Suicidal Thoughts:  No  Homicidal Thoughts:  No  Memory:  Immediate;   Good Recent;   Good Remote;   Good  Judgement:  Fair  Insight:  Shallow  Psychomotor Activity:  Normal  Concentration:  Fair  Recall:  NA  Fund of Knowledge:Good  Language: Good  Akathisia:  NA  Handed:  Right  AIMS (if indicated):     Assets:  Desire for Improvement  Sleep:       Musculoskeletal: Strength & Muscle Tone: within normal limits Gait & Station: normal Patient leans: N/A   Mental Status Per Nursing Assessment::   On Admission:     Current Mental Status by Physician: NA  Loss Factors: NA  Historical Factors: Impulsivity  Risk Reduction Factors:   Employed and Living with another person, especially a relative  Continued Clinical Symptoms:  Bipolar Disorder:   Mixed State  Cognitive Features That Contribute To Risk:  Polarized thinking    Suicide Risk:  Minimal: No identifiable suicidal ideation.  Patients presenting with no risk factors but with morbid ruminations; may be classified as minimal risk based on the severity of the depressive symptoms  Discharge Diagnoses:   AXIS I:  Bipolar, Manic AXIS II:  Deferred AXIS III:   Past Medical History  Diagnosis Date  . Medical history non-contributory     AXIS IV:  other psychosocial or environmental problems, problems related to legal system/crime and problems related to social environment AXIS V:  61-70 mild symptoms  Plan Of Care/Follow-up recommendations:  Activity:  As tolerated Diet:  Regular  Is patient on multiple antipsychotic therapies at discharge:  No   Has Patient had three or more failed trials of antipsychotic monotherapy by history:  No  Recommended Plan for Multiple Antipsychotic Therapies: NA    Dahlia ByesONUOHA, Nissan Frazzini, C   PMHNP-BC 02/01/2014, 12:39 PM

## 2014-02-01 NOTE — ED Notes (Signed)
Sleeping, easily aroused, pt is aware that [facility] after breakfast

## 2014-02-01 NOTE — Progress Notes (Signed)
Patient ID: Victor [name], male   DOB: 01/19/1981, 33 y.o.   MRN: [id number] Diagnosis: Bipolar Manic Patient was seen this am by Dr Elsie SaasJonnalagadda and this writer on rounds.  Patient was calm, cooperative but stated he did not take his night time Seroquel.  Patient repeated that he has not taken any Bipolar medications and will not now.  He stated that he only uses Marijuana to calm self down.  Patient stated that he has been on the phone with his family and father is ready to pick him up when he is discharged.  Patient denies SI/HI/AVH and is asking for discharge home to his parents.  Patient plans to move to [place] next week to stay with his girl friend. This Clinical research associatewriter spoke to his father who is in agreement to come and pick patient up.  Patient will be discharged home as soon as his ride is here. Marland Kitchen.Psychiatric Specialty Exam: Physical Exam  ROS  Blood pressure 143/81, pulse 77, temperature 97.9 F (36.6 C), temperature source Oral, resp. rate 16, height 5\' 9"  (1.753 m), weight 81.647 kg (180 lb), SpO2 97.00%.Body mass index is 26.57 kg/(m^2).  General Appearance: Casual  Eye Contact::  Good  Speech:  Clear and Coherent and Normal Rate  Volume:  Normal  Mood:  Euthymic  Affect:  Appropriate and Congruent  Thought Process:  Goal Directed  Orientation:  Full (Time, Place, and Person)  Thought Content:  NA  Suicidal Thoughts:  No  Homicidal Thoughts:  No  Memory:  Immediate;   Good Recent;   Good Remote;   Good  Judgement:  Fair  Insight:  Shallow  Psychomotor Activity:  Normal  Concentration:  Fair  Recall:  NA  Akathisia:  NA  Handed:  Right  AIMS (if indicated):     Assets:  Desire for Improvement  Sleep:         Plan:  Discharge home.  Dahlia ByesJosephine Onuoha   PMHNP-BC  Patient was seen face to face for psychiatric evaluation, case discussed with physician extender and reviewed the information documented and agree with the treatment plan.   Victor [name],Victor [name]. 02/01/2014 8:41  PM

## 2014-02-01 NOTE — ED Notes (Signed)
D/C instructions reviewed with understanding stated. No prescriptions written. Denies pain. No complaints voiced. Ambulatory without difficulty. Denies SI/HI or AVH. Belongings bag x2 returned. Escorted by [facility] to front of hospital.

## 2014-02-01 NOTE — ED Notes (Signed)
Boxers put in patient's belongings bag.
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
## 2018-11-26 ENCOUNTER — Inpatient Hospital Stay
Admit: 2018-11-26 | Discharge: 2018-11-26 | Disposition: A | Discharge status: Home / Self Care | Payer: MEDICARE | Attending: Family

## 2018-11-26 DIAGNOSIS — H6502 Acute serous otitis media, left ear: Secondary | ICD-10-CM

## 2018-11-26 MED ORDER — AZITHROMYCIN 250 MG TAB
250 mg | ORAL_TABLET | ORAL | 0 refills | Status: AC
Start: 2018-11-26 — End: 2018-12-01

## 2018-11-26 MED ORDER — CIPROFLOXACIN-DEXAMETHASONE 0.3 %-0.1 % EAR DROPS, SUSP
Freq: Two times a day (BID) | OTIC | 0 refills | Status: AC
Start: 2018-11-26 — End: 2018-12-03

## 2018-11-26 MED ORDER — AMOXICILLIN 875 MG TAB
875 mg | ORAL_TABLET | Freq: Two times a day (BID) | ORAL | 0 refills | Status: AC
Start: 2018-11-26 — End: 2018-12-06

## 2018-11-26 NOTE — Other (Signed)
One week ago got water in his left ear during shower and for past 3 days has had ear pain.

## 2018-11-26 NOTE — Other (Addendum)
37 year old male presents to urgent care today for evaluation of left ear pain for 1 week worsening over the last 3 days.  He states that 1 week ago while in the shower he ???got more water in my left ear than I ever have???.   Patient denies any fever, chills, headache, sore throat, cough, shortness of breath, abdominal pain, nausea/vomiting/diarrhea, skin rash. He has not been taking any medications over-the-counter for pain relief.  He does not have a history of recurrent otitis.  The he denies any injury to his ear.  He has not had any cough or cold symptoms recently.                                    History reviewed. No pertinent past medical history.     History reviewed. No pertinent surgical history.      History reviewed. No pertinent family history.     Social History     Socioeconomic History   ??? Marital status: SINGLE     Spouse name: Not on file   ??? Number of children: Not on file   ??? Years of education: Not on file   ??? Highest education level: Not on file   Occupational History   ??? Not on file   Social Needs   ??? Financial resource strain: Not on file   ??? Food insecurity:     Worry: Not on file     Inability: Not on file   ??? Transportation needs:     Medical: Not on file     Non-medical: Not on file   Tobacco Use   ??? Smoking status: Current Every Day Smoker   ??? Smokeless tobacco: Never Used   Substance and Sexual Activity   ??? Alcohol use: Yes     Comment: rarely   ??? Drug use: Yes     Types: Marijuana   ??? Sexual activity: Not on file   Lifestyle   ??? Physical activity:     Days per week: Not on file     Minutes per session: Not on file   ??? Stress: Not on file   Relationships   ??? Social connections:     Talks on phone: Not on file     Gets together: Not on file     Attends religious service: Not on file     Active member of club or organization: Not on file     Attends meetings of clubs or organizations: Not on file     Relationship status: Not on file   ??? Intimate partner violence:      Fear of current or ex partner: Not on file     Emotionally abused: Not on file     Physically abused: Not on file     Forced sexual activity: Not on file   Other Topics Concern   ??? Not on file   Social History Narrative   ??? Not on file        ALLERGIES: Patient has no known allergies.    Review of Systems   Constitutional: Negative.    HENT: Positive for ear pain. Negative for congestion, dental problem, drooling, ear discharge, facial swelling, hearing loss, mouth sores, nosebleeds, postnasal drip, rhinorrhea, sinus pressure, sinus pain, sneezing, sore throat, tinnitus, trouble swallowing and voice change.    Eyes: Negative.    Respiratory: Negative.    Cardiovascular: Negative.  Gastrointestinal: Negative.    Endocrine: Negative.    Genitourinary: Negative.    Musculoskeletal: Negative.    Skin: Negative.    Allergic/Immunologic: Negative.    Hematological: Negative.        Vitals:    11/26/18 1147   BP: 116/69   Pulse: 63   Resp: 14   Temp: 97.7 ??F (36.5 ??C)   SpO2: 97%   Weight: 81.6 kg (180 lb)   Height: 5\' 9"  (1.753 m)       Physical Exam  Vitals signs and nursing note reviewed.   Constitutional:       General: He is not in acute distress.     Appearance: Normal appearance. He is normal weight. He is not ill-appearing or toxic-appearing.   HENT:      Head: Normocephalic and atraumatic.      Jaw: No trismus, tenderness, swelling or pain on movement.      Right Ear: Tympanic membrane, ear canal and external ear normal. There is no impacted cerumen.      Left Ear: External ear normal. No decreased hearing noted. No drainage, swelling or tenderness.  No middle ear effusion. There is no impacted cerumen. No mastoid tenderness. Tympanic membrane is injected. Tympanic membrane is not bulging.      Nose: Nose normal. No congestion or rhinorrhea.      Mouth/Throat:      Mouth: Mucous membranes are moist.      Pharynx: Oropharynx is clear. No oropharyngeal exudate or posterior oropharyngeal erythema.   Eyes:       General: No scleral icterus.        Right eye: No discharge.         Left eye: No discharge.      Extraocular Movements: Extraocular movements intact.      Conjunctiva/sclera: Conjunctivae normal.      Pupils: Pupils are equal, round, and reactive to light.   Neck:      Musculoskeletal: Normal range of motion and neck supple. No neck rigidity or muscular tenderness.   Cardiovascular:      Rate and Rhythm: Normal rate and regular rhythm.      Pulses: Normal pulses.      Heart sounds: Normal heart sounds.   Pulmonary:      Effort: Pulmonary effort is normal.      Breath sounds: Normal breath sounds.   Musculoskeletal: Normal range of motion.   Lymphadenopathy:      Cervical: No cervical adenopathy.   Skin:     General: Skin is warm and dry.      Capillary Refill: Capillary refill takes less than 2 seconds.   Neurological:      Mental Status: He is alert and oriented to person, place, and time.       MDM   37 year old male presents to urgent care for evaluation of 1 week of left ear pain, worsening last 3 days.  He denies any trauma.  He has had no cough or cold symptoms. He thinks that while he was showering 1 week ago he cut to much water into his ear that caused irritation. Patient with a left Otitis media and left otitis externa. Negative mastoid tenderness. Non-toxic appearing and well hydrated.  Will go forward with Amoxicillin treatment as well as ciprodex treatment for externa.  All questions and concerns addressed and answered.  Patient/parent agrees with the plan of care.        Procedures

## 2018-11-26 NOTE — ED Notes (Signed)
One week ago got water in his left ear during shower and for past 3 days has had ear pain.

## 2018-11-26 NOTE — ED Provider Notes (Signed)
37 year old male presents to urgent care today for evaluation of left ear pain for 1 week worsening over the last 3 days.  He states that 1 week ago while in the shower he ???got more water in my left ear than I ever have???.   Patient denies any fever, chills, headache, sore throat, cough, shortness of breath, abdominal pain, nausea/vomiting/diarrhea, skin rash. He has not been taking any medications over-the-counter for pain relief.  He does not have a history of recurrent otitis.  The he denies any injury to his ear.  He has not had any cough or cold symptoms recently.                                    History reviewed. No pertinent past medical history.     History reviewed. No pertinent surgical history.      History reviewed. No pertinent family history.     Social History     Socioeconomic History   ??? Marital status: SINGLE     Spouse name: Not on file   ??? Number of children: Not on file   ??? Years of education: Not on file   ??? Highest education level: Not on file   Occupational History   ??? Not on file   Social Needs   ??? Financial resource strain: Not on file   ??? Food insecurity:     Worry: Not on file     Inability: Not on file   ??? Transportation needs:     Medical: Not on file     Non-medical: Not on file   Tobacco Use   ??? Smoking status: Current Every Day Smoker   ??? Smokeless tobacco: Never Used   Substance and Sexual Activity   ??? Alcohol use: Yes     Comment: rarely   ??? Drug use: Yes     Types: Marijuana   ??? Sexual activity: Not on file   Lifestyle   ??? Physical activity:     Days per week: Not on file     Minutes per session: Not on file   ??? Stress: Not on file   Relationships   ??? Social connections:     Talks on phone: Not on file     Gets together: Not on file     Attends religious service: Not on file     Active member of club or organization: Not on file     Attends meetings of clubs or organizations: Not on file     Relationship status: Not on file   ??? Intimate partner violence:     Fear of current or ex  partner: Not on file     Emotionally abused: Not on file     Physically abused: Not on file     Forced sexual activity: Not on file   Other Topics Concern   ??? Not on file   Social History Narrative   ??? Not on file        ALLERGIES: Patient has no known allergies.    Review of Systems   Constitutional: Negative.    HENT: Positive for ear pain. Negative for congestion, dental problem, drooling, ear discharge, facial swelling, hearing loss, mouth sores, nosebleeds, postnasal drip, rhinorrhea, sinus pressure, sinus pain, sneezing, sore throat, tinnitus, trouble swallowing and voice change.    Eyes: Negative.    Respiratory: Negative.    Cardiovascular: Negative.  Gastrointestinal: Negative.    Endocrine: Negative.    Genitourinary: Negative.    Musculoskeletal: Negative.    Skin: Negative.    Allergic/Immunologic: Negative.    Hematological: Negative.        Vitals:    11/26/18 1147   BP: 116/69   Pulse: 63   Resp: 14   Temp: 97.7 ??F (36.5 ??C)   SpO2: 97%   Weight: 81.6 kg (180 lb)   Height: 5\' 9"  (1.753 m)       Physical Exam  Vitals signs and nursing note reviewed.   Constitutional:       General: He is not in acute distress.     Appearance: Normal appearance. He is normal weight. He is not ill-appearing or toxic-appearing.   HENT:      Head: Normocephalic and atraumatic.      Jaw: No trismus, tenderness, swelling or pain on movement.      Right Ear: Tympanic membrane, ear canal and external ear normal. There is no impacted cerumen.      Left Ear: External ear normal. No decreased hearing noted. No drainage, swelling or tenderness.  No middle ear effusion. There is no impacted cerumen. No mastoid tenderness. Tympanic membrane is injected. Tympanic membrane is not bulging.      Nose: Nose normal. No congestion or rhinorrhea.      Mouth/Throat:      Mouth: Mucous membranes are moist.      Pharynx: Oropharynx is clear. No oropharyngeal exudate or posterior oropharyngeal erythema.   Eyes:      General: No scleral  icterus.        Right eye: No discharge.         Left eye: No discharge.      Extraocular Movements: Extraocular movements intact.      Conjunctiva/sclera: Conjunctivae normal.      Pupils: Pupils are equal, round, and reactive to light.   Neck:      Musculoskeletal: Normal range of motion and neck supple. No neck rigidity or muscular tenderness.   Cardiovascular:      Rate and Rhythm: Normal rate and regular rhythm.      Pulses: Normal pulses.      Heart sounds: Normal heart sounds.   Pulmonary:      Effort: Pulmonary effort is normal.      Breath sounds: Normal breath sounds.   Musculoskeletal: Normal range of motion.   Lymphadenopathy:      Cervical: No cervical adenopathy.   Skin:     General: Skin is warm and dry.      Capillary Refill: Capillary refill takes less than 2 seconds.   Neurological:      Mental Status: He is alert and oriented to person, place, and time.       MDM   37 year old male presents to urgent care for evaluation of 1 week of left ear pain, worsening last 3 days.  He denies any trauma.  He has had no cough or cold symptoms. He thinks that while he was showering 1 week ago he cut to much water into his ear that caused irritation. Patient with a left Otitis media and left otitis externa. Negative mastoid tenderness. Non-toxic appearing and well hydrated.  Will go forward with Amoxicillin treatment as well as ciprodex treatment for externa.  All questions and concerns addressed and answered.  Patient/parent agrees with the plan of care.        Procedures

## 2019-06-28 DIAGNOSIS — R51 Headache: Secondary | ICD-10-CM

## 2019-06-28 NOTE — ED Triage Notes (Addendum)
Pt states "I am dehydrated"  She states he has felt this way since 10 am. Pt is very argumentative and will not answer questions without becoming argumentative.  He states he is supposed to be taking Seroquel but stopped taking it a month ago.  Pt states he lives in his vehicle.

## 2019-06-28 NOTE — ED Notes (Signed)
 Pt states I am dehydrated  She states he has felt this way since 10 am. Pt is very argumentative and will not answer questions without becoming argumentative.  He states he is supposed to be taking Seroquel but stopped taking it a month ago.  Pt states he lives in his vehicle.

## 2019-06-29 ENCOUNTER — Inpatient Hospital Stay
Admit: 2019-06-29 | Discharge: 2019-06-29 | Disposition: A | Discharge status: Home / Self Care | Payer: MEDICARE | Attending: Emergency Medicine

## 2019-06-29 MED ORDER — SODIUM CHLORIDE 0.9% BOLUS IV
0.9 % | Freq: Once | INTRAVENOUS | Status: AC
Start: 2019-06-29 — End: 2019-06-29
  Administered 2019-06-29: 05:00:00 via INTRAVENOUS

## 2019-06-29 MED FILL — SODIUM CHLORIDE 0.9 % IV: INTRAVENOUS | Qty: 1000

## 2019-06-29 NOTE — ED Notes (Signed)
Pt here for eval of possible dehydration, pt appears to be a manic phase. hw placwed, 1 liter of n/s given.

## 2019-06-29 NOTE — ED Notes (Signed)
Pt home with instructions, increase fluids, f/u pmd, back to er as needed

## 2019-06-29 NOTE — ED Provider Notes (Signed)
Patient is a 38 year old male who presents now to the emergency department complaining of feeling dehydrated and requesting intravenous fluids.  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  This morning at work he developed a headache and he felt like his body was ???burning up.???  He feels like he is dehydrated.  Wants intravenous fluids and states he has gotten intravenous fluids in the past in the emergency department.  The patient is a difficult historian.  He tells me that his parents live in [place] which is where he is originally from.  He was diagnosed with bipolar disorder and given a prescription for Seroquel but he left [place] because he wanted to get away from his parents and he blames ???pill form medication??? including Seroquel for ruling his life.  He states ???it makes me act up.???  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  He is upset because he states that he needs place to live and a car and 250,000 dollars but no one will give it to him and no one will help him.  He denies any suicidal or homicidal ideations or hallucinations.  He states he does not want psychiatric treatment at this time because ???I am not crazy.???    The history is provided by the patient.   Other   Associated symptoms include headaches. Pertinent negatives include no shortness of breath.        Past Medical History:   Diagnosis Date   ??? Psychiatric disorder        History reviewed. No pertinent surgical history.      History reviewed. No pertinent family history.    Social History     Socioeconomic History   ??? Marital status: SINGLE     Spouse name: Not on file   ??? Number of children: Not on file   ??? Years of education: Not on file   ??? Highest education level: Not on file   Occupational History   ??? Not on file   Social Needs   ??? Financial resource strain: Not on file    ??? Food insecurity     Worry: Not on file     Inability: Not on file   ??? Transportation needs     Medical: Not on file     Non-medical: Not on file   Tobacco Use   ??? Smoking status: Current Every Day Smoker   ??? Smokeless tobacco: Never Used   Substance and Sexual Activity   ??? Alcohol use: Yes     Comment: rarely   ??? Drug use: Yes     Types: Marijuana   ??? Sexual activity: Not on file   Lifestyle   ??? Physical activity     Days per week: Not on file     Minutes per session: Not on file   ??? Stress: Not on file   Relationships   ??? Social Product manager on phone: Not on file     Gets together: Not on file     Attends religious service: Not on file     Active member of club or organization: Not on file     Attends meetings of clubs or organizations: Not on file     Relationship status: Not on file   ??? Intimate partner violence  Fear of current or ex partner: Not on file     Emotionally abused: Not on file     Physically abused: Not on file     Forced sexual activity: Not on file   Other Topics Concern   ??? Not on file   Social History Narrative   ??? Not on file         ALLERGIES: Amoxicillin    Review of Systems   Constitutional: Negative for appetite change.        Feeling hot   HENT: Negative for congestion and sore throat.    Eyes: Negative.    Respiratory: Negative for cough and shortness of breath.    Cardiovascular: Negative.    Gastrointestinal: Negative.    Genitourinary: Negative.    Musculoskeletal: Negative.    Skin: Negative.    Neurological: Positive for headaches.   Psychiatric/Behavioral: Positive for sleep disturbance (Patient attributes this to work).   All other systems reviewed and are negative.      Vitals:    06/28/19 2239   BP: 116/79   Resp: 16   Temp: 98.4 ??F (36.9 ??C)   SpO2: 97%   Weight: 77.1 kg (170 lb)   Height: 5\' 9"  (1.753 m)            Physical Exam  Vitals signs and nursing note reviewed.   Psychiatric:         Attention and Perception: Attention and perception normal.          Mood and Affect: Affect is angry.         Speech: Speech is tangential.         Behavior: Behavior is agitated (Mild, patient is irritable). Behavior is cooperative.         Thought Content: Thought content is not paranoid or delusional. Thought content does not include homicidal or suicidal ideation.         Cognition and Memory: Cognition and memory normal.         Judgment: Judgment is inappropriate.     General: awake and alert, no acute distress, patient uses foul language with frequent swearing  Head: normocephalic, atraumatic  Neck:  Supple, painless range of motion, no cervical lymphadenopathy  HEENT: PERRL bilaterally, no conjunctival pallor, mildly dry mucous membranes  Cardiovascular: heart regular rate and rhythm without murmurs, rubs or gallops, equal bilateral radial and DP pulses  Pulmonary: lungs clear to auscultation bilaterally, no wheezes rales or rhonchi, no respiratory distress  Abdomen: soft, nontender, nondistended, normoactive bowel sounds  Extremities: no lower extremity edema, no joint swelling, no bilateral calf swelling or tenderness to palpation  Skin: pink, warm, dry, no rashes  Neuro: alert and oriented, moves all extremities equally and spontaneously, ambulates without ataxia or need for assistance, no focal neurologic deficits         MDM  Number of Diagnoses or Management Options  Acute nonintractable headache, unspecified headache type:   Diagnosis management comments: This is a 38 year old male who according to his records here at [facility] has a history of bipolar 1 disorder for which he takes Seroquel 50 mg nightly as previously prescribed in [place] who presents now to the emergency department with a chief complaint of a headache and feeling dehydrated.  The patient is somewhat of a rambling historian.  He is redirectable but tangential.  He is easily frustrated and somewhat irritable although he was able to calm down while  talking to me despite the fact that  he had caused difficulties for the staff in triage.  He is denying any suicidal or homicidal ideations and while I do believe he is mildly manic or hypomanic he does not me involuntary commitment criteria at this time.  The patient is alert and oriented x3.  He is adamant that he does not want any Seroquel or any other ???pill form medication??? at this time because he feels strongly that it has done him harm in the past.  It sounds like he has been having disagreements with his parents who still live in [place] about the need to continue taking his Seroquel.  He does not appear to have close follow-up or a case manager living in the area.  He is living in his vehicle.  The patient will receive 1 L intravenous normal saline although I am not convinced that he is truly dehydrated.  I do not have the ability to force him to have a psychiatric evaluation or to take Seroquel.  The patient will therefore be discharged after his intravenous fluid treatment unless he changes his mind about seeking psychiatric care.         Procedures      NIH Stroke Scale

## 2019-06-29 NOTE — ED Notes (Signed)
Pt home with instructions, increase fluids, f/u pmd, back to er as needed

## 2019-06-29 NOTE — ED Provider Notes (Signed)
Patient is a 38 year old male who presents now to the emergency department complaining of feeling dehydrated and requesting intravenous fluids.  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  This morning at work he developed a headache and he felt like his body was ???burning up.???  He feels like he is dehydrated.  Wants intravenous fluids and states he has gotten intravenous fluids in the past in the emergency department.  The patient is a difficult historian.  He tells me that his parents live in [place] which is where he is originally from.  He was diagnosed with bipolar disorder and given a prescription for Seroquel but he left [place] because he wanted to get away from his parents and he blames ???pill form medication??? including Seroquel for ruling his life.  He states ???it makes me act up.???  He tells me that since he moved to [place] that he has been using his medical marijuana card to by marijuana which he uses daily after work.  He is upset because he states that he needs place to live and a car and 250,000 dollars but no one will give it to him and no one will help him.  He denies any suicidal or homicidal ideations or hallucinations.  He states he does not want psychiatric treatment at this time because ???I am not crazy.???    The history is provided by the patient.   Other   Associated symptoms include headaches. Pertinent negatives include no shortness of breath.        Past Medical History:   Diagnosis Date   ??? Psychiatric disorder        History reviewed. No pertinent surgical history.      History reviewed. No pertinent family history.    Social History     Socioeconomic History   ??? Marital status: SINGLE     Spouse name: Not on file   ??? Number of children: Not on file   ??? Years of education: Not on file   ??? Highest education level: Not on file   Occupational History   ??? Not on file   Social Needs   ??? Financial resource strain: Not on file   ??? Food insecurity      Worry: Not on file     Inability: Not on file   ??? Transportation needs     Medical: Not on file     Non-medical: Not on file   Tobacco Use   ??? Smoking status: Current Every Day Smoker   ??? Smokeless tobacco: Never Used   Substance and Sexual Activity   ??? Alcohol use: Yes     Comment: rarely   ??? Drug use: Yes     Types: Marijuana   ??? Sexual activity: Not on file   Lifestyle   ??? Physical activity     Days per week: Not on file     Minutes per session: Not on file   ??? Stress: Not on file   Relationships   ??? Social Wellsite geologistconnections     Talks on phone: Not on file     Gets together: Not on file     Attends religious service: Not on file     Active member of club or organization: Not on file     Attends meetings of clubs or organizations: Not on file     Relationship status: Not on file   ??? Intimate partner violence  Fear of current or ex partner: Not on file     Emotionally abused: Not on file     Physically abused: Not on file     Forced sexual activity: Not on file   Other Topics Concern   ??? Not on file   Social History Narrative   ??? Not on file         ALLERGIES: Amoxicillin    Review of Systems   Constitutional: Negative for appetite change.        Feeling hot   HENT: Negative for congestion and sore throat.    Eyes: Negative.    Respiratory: Negative for cough and shortness of breath.    Cardiovascular: Negative.    Gastrointestinal: Negative.    Genitourinary: Negative.    Musculoskeletal: Negative.    Skin: Negative.    Neurological: Positive for headaches.   Psychiatric/Behavioral: Positive for sleep disturbance (Patient attributes this to work).   All other systems reviewed and are negative.      Vitals:    06/28/19 2239   BP: 116/79   Resp: 16   Temp: 98.4 ??F (36.9 ??C)   SpO2: 97%   Weight: 77.1 kg (170 lb)   Height: 5\' 9"  (1.753 m)            Physical Exam  Vitals signs and nursing note reviewed.   Psychiatric:         Attention and Perception: Attention and perception normal.         Mood and Affect: Affect  is angry.         Speech: Speech is tangential.         Behavior: Behavior is agitated (Mild, patient is irritable). Behavior is cooperative.         Thought Content: Thought content is not paranoid or delusional. Thought content does not include homicidal or suicidal ideation.         Cognition and Memory: Cognition and memory normal.         Judgment: Judgment is inappropriate.     General: awake and alert, no acute distress, patient uses foul language with frequent swearing  Head: normocephalic, atraumatic  Neck:  Supple, painless range of motion, no cervical lymphadenopathy  HEENT: PERRL bilaterally, no conjunctival pallor, mildly dry mucous membranes  Cardiovascular: heart regular rate and rhythm without murmurs, rubs or gallops, equal bilateral radial and DP pulses  Pulmonary: lungs clear to auscultation bilaterally, no wheezes rales or rhonchi, no respiratory distress  Abdomen: soft, nontender, nondistended, normoactive bowel sounds  Extremities: no lower extremity edema, no joint swelling, no bilateral calf swelling or tenderness to palpation  Skin: pink, warm, dry, no rashes  Neuro: alert and oriented, moves all extremities equally and spontaneously, ambulates without ataxia or need for assistance, no focal neurologic deficits         MDM  Number of Diagnoses or Management Options  Acute nonintractable headache, unspecified headache type:   Diagnosis management comments: This is a 38 year old male who according to his records here at [facility] has a history of bipolar 1 disorder for which he takes Seroquel 50 mg nightly as previously prescribed in [place] who presents now to the emergency department with a chief complaint of a headache and feeling dehydrated.  The patient is somewhat of a rambling historian.  He is redirectable but tangential.  He is easily frustrated and somewhat irritable although he was able to calm down while talking to me despite the fact that he  had caused difficulties for  the staff in triage.  He is denying any suicidal or homicidal ideations and while I do believe he is mildly manic or hypomanic he does not me involuntary commitment criteria at this time.  The patient is alert and oriented x3.  He is adamant that he does not want any Seroquel or any other ???pill form medication??? at this time because he feels strongly that it has done him harm in the past.  It sounds like he has been having disagreements with his parents who still live in [place] about the need to continue taking his Seroquel.  He does not appear to have close follow-up or a case manager living in the area.  He is living in his vehicle.  The patient will receive 1 L intravenous normal saline although I am not convinced that he is truly dehydrated.  I do not have the ability to force him to have a psychiatric evaluation or to take Seroquel.  The patient will therefore be discharged after his intravenous fluid treatment unless he changes his mind about seeking psychiatric care.         Procedures      NIH Stroke Scale

## 2019-06-29 NOTE — ED Notes (Signed)
Pt here for eval of possible dehydration, pt appears to be a manic phase. hw placwed, 1 liter of n/s given.

## 2019-08-30 ENCOUNTER — Inpatient Hospital Stay
Admit: 2019-08-30 | Discharge: 2019-08-30 | Disposition: A | Discharge status: Home / Self Care | Payer: MEDICARE | Attending: Physician Assistant

## 2019-08-30 DIAGNOSIS — Z20828 Contact with and (suspected) exposure to other viral communicable diseases: Secondary | ICD-10-CM

## 2019-08-30 NOTE — Other (Signed)
Boyfriend to another patient being tested for covid-19 today.  His girlfriend works for a [employer], labor, works for [employer].  Had a close contact with a coworker who had close contact with a coworker who was positive.     Pt denies any symptoms.  Denies having fevers, chills, sweats, change in taste/smell, chest pain, cough, SOB, sore throat, congestion, ear pain, abd pain, N/V, D/C, rash, headache.              Past Medical History:   Diagnosis Date   ??? Psychiatric disorder         No past surgical history on file.      No family history on file.     Social History     Socioeconomic History   ??? Marital status: SINGLE     Spouse name: Not on file   ??? Number of children: Not on file   ??? Years of education: Not on file   ??? Highest education level: Not on file   Occupational History   ??? Not on file   Social Needs   ??? Financial resource strain: Not on file   ??? Food insecurity     Worry: Not on file     Inability: Not on file   ??? Transportation needs     Medical: Not on file     Non-medical: Not on file   Tobacco Use   ??? Smoking status: Current Every Day Smoker   ??? Smokeless tobacco: Never Used   Substance and Sexual Activity   ??? Alcohol use: Yes     Comment: rarely   ??? Drug use: Yes     Types: Marijuana   ??? Sexual activity: Not on file   Lifestyle   ??? Physical activity     Days per week: Not on file     Minutes per session: Not on file   ??? Stress: Not on file   Relationships   ??? Social Product manager on phone: Not on file     Gets together: Not on file     Attends religious service: Not on file     Active member of club or organization: Not on file     Attends meetings of clubs or organizations: Not on file     Relationship status: Not on file   ??? Intimate partner violence     Fear of current or ex partner: Not on file     Emotionally abused: Not on file     Physically abused: Not on file     Forced sexual activity: Not on file   Other Topics Concern   ??? Not on file   Social History Narrative   ??? Not on file                 ALLERGIES: Amoxicillin    Review of Systems   Constitutional: Negative.    HENT: Negative.    Eyes: Negative.    Respiratory: Negative.    Cardiovascular: Negative.    Gastrointestinal: Negative.    Genitourinary: Negative.    Musculoskeletal: Negative.    Skin: Negative.    Neurological: Negative.        There were no vitals filed for this visit.    Physical Exam     GENERAL: well-developed, well-nourished, in no distress  HEAD: atraumatic, normocephalic  EYES: PERRLA, no eye discharge or injection  NOSE: nasal turbinates non-swollen, moist without discharge  OROPHARYNX: buccal mucosa  moist, oropharynx clear; no posterior pharyngeal cobblestoning; no tonsillar swelling, injection or exudates. No trismus.  NECK: supple, full ROM, non-tender, no stridor  LUNGS: Non labored, regular rate and effort.  HEART: regular rate and rhythm          MDM  Number of Diagnoses or Management Options  Person under investigation for COVID-19:   Diagnosis management comments: Exposure to  COVID-19 virus infection.  Patient's vitals stable, patient nontoxic-appearing well-hydrated. Exposure to covid-19, asymptomatic.  CDC quarentine guidelines given to the patient. Patient without signs of bacterial sinusitis, otitis media, pharyngitis, or pneumonia on examination.Reviewed concerning signs and symptoms to report to the emergency department. All questions and concerns addressed and answered.  Patient agrees with the plan of care.     He appeared intoxicated, swearing heavily during exam.  Did not allow us to do the nasal swab.  However under our guidance he did his own nasal swab each nostril for 10 seconds.              Procedures

## 2019-08-30 NOTE — Other (Signed)
Pt wanted a covid test. Pt denies symptoms. Pt was very agitated during intake.

## 2019-08-30 NOTE — ED Notes (Signed)
Pt wanted a covid test. Pt denies symptoms. Pt was very agitated during intake.

## 2019-08-30 NOTE — ED Provider Notes (Signed)
Boyfriend to another patient being tested for covid-19 today.  His girlfriend works for a [employer], labor, works for [employer].  Had a close contact with a coworker who had close contact with a coworker who was positive.     Pt denies any symptoms.  Denies having fevers, chills, sweats, change in taste/smell, chest pain, cough, SOB, sore throat, congestion, ear pain, abd pain, N/V, D/C, rash, headache.              Past Medical History:   Diagnosis Date   ??? Psychiatric disorder         No past surgical history on file.      No family history on file.     Social History     Socioeconomic History   ??? Marital status: SINGLE     Spouse name: Not on file   ??? Number of children: Not on file   ??? Years of education: Not on file   ??? Highest education level: Not on file   Occupational History   ??? Not on file   Social Needs   ??? Financial resource strain: Not on file   ??? Food insecurity     Worry: Not on file     Inability: Not on file   ??? Transportation needs     Medical: Not on file     Non-medical: Not on file   Tobacco Use   ??? Smoking status: Current Every Day Smoker   ??? Smokeless tobacco: Never Used   Substance and Sexual Activity   ??? Alcohol use: Yes     Comment: rarely   ??? Drug use: Yes     Types: Marijuana   ??? Sexual activity: Not on file   Lifestyle   ??? Physical activity     Days per week: Not on file     Minutes per session: Not on file   ??? Stress: Not on file   Relationships   ??? Social Product manager on phone: Not on file     Gets together: Not on file     Attends religious service: Not on file     Active member of club or organization: Not on file     Attends meetings of clubs or organizations: Not on file     Relationship status: Not on file   ??? Intimate partner violence     Fear of current or ex partner: Not on file     Emotionally abused: Not on file     Physically abused: Not on file     Forced sexual activity: Not on file   Other Topics Concern   ??? Not on file   Social History Narrative   ??? Not on file                 ALLERGIES: Amoxicillin    Review of Systems   Constitutional: Negative.    HENT: Negative.    Eyes: Negative.    Respiratory: Negative.    Cardiovascular: Negative.    Gastrointestinal: Negative.    Genitourinary: Negative.    Musculoskeletal: Negative.    Skin: Negative.    Neurological: Negative.        There were no vitals filed for this visit.    Physical Exam     GENERAL: well-developed, well-nourished, in no distress  HEAD: atraumatic, normocephalic  EYES: PERRLA, no eye discharge or injection  NOSE: nasal turbinates non-swollen, moist without discharge  OROPHARYNX: buccal mucosa  moist, oropharynx clear; no posterior pharyngeal cobblestoning; no tonsillar swelling, injection or exudates. No trismus.  NECK: supple, full ROM, non-tender, no stridor  LUNGS: Non labored, regular rate and effort.  HEART: regular rate and rhythm          MDM  Number of Diagnoses or Management Options  Person under investigation for COVID-19:   Diagnosis management comments: Exposure to  COVID-19 virus infection.  Patient's vitals stable, patient nontoxic-appearing well-hydrated. Exposure to covid-19, asymptomatic.  CDC quarentine guidelines given to the patient. Patient without signs of bacterial sinusitis, otitis media, pharyngitis, or pneumonia on examination.Reviewed concerning signs and symptoms to report to the emergency department. All questions and concerns addressed and answered.  Patient agrees with the plan of care.     He appeared intoxicated, swearing heavily during exam.  Did not allow [place] to do the nasal swab.  However under our guidance he did his own nasal swab each nostril for 10 seconds.              Procedures

## 2019-09-02 LAB — SARS-COV-2: COVID-19, ME/NH PANEL: NOT DETECTED

## 2019-09-02 LAB — COVID-19: Covid-19 Me/Nh Panel: NOT DETECTED

## 2019-09-12 ENCOUNTER — Emergency Department: Payer: MEDICARE | Primary: Physician Assistant

## 2019-09-12 ENCOUNTER — Inpatient Hospital Stay
Admit: 2019-09-12 | Discharge: 2019-09-12 | Discharge status: Against Medical Advice | Payer: MEDICARE | Attending: Adult Health

## 2019-09-12 DIAGNOSIS — L03031 Cellulitis of right toe: Secondary | ICD-10-CM

## 2019-09-12 MED ORDER — ACETAMINOPHEN 325 MG TABLET
325 mg | ORAL | Status: DC
Start: 2019-09-12 — End: 2019-09-12

## 2019-09-12 MED ORDER — IBUPROFEN 400 MG TAB
400 mg | ORAL | Status: DC
Start: 2019-09-12 — End: 2019-09-12

## 2019-09-12 MED ORDER — DIPHTH,PERTUS(AC)TETANUS VAC(PF) 2.5 LF UNIT-8 MCG-5 LF/0.5 ML INJ
INTRAMUSCULAR | Status: AC
Start: 2019-09-12 — End: 2019-09-12
  Administered 2019-09-12: 20:00:00 via INTRAMUSCULAR

## 2019-09-12 MED FILL — IBUPROFEN 400 MG TAB: 400 mg | ORAL | Qty: 2

## 2019-09-12 MED FILL — BOOSTRIX TDAP 2.5 LF UNIT-8 MCG-5 LF/0.5 ML INTRAMUSCULAR SUSPENSION: INTRAMUSCULAR | Qty: 0.5

## 2019-09-12 MED FILL — ACETAMINOPHEN 325 MG TABLET: 325 mg | ORAL | Qty: 3

## 2019-09-12 NOTE — Progress Notes (Signed)
Patient return to the ER after leaving AMA and was treated with doxycycline, he return to the ER today with the call to return as above, the toe is healing well.  Advised to continue to take the doxycycline with the culture showing susceptibility to tetracyclines.

## 2019-09-12 NOTE — ED Notes (Signed)
Pt declines xray, ibu and tyl.  Did accept Tdap  States he has to be somewhere by 1630, will not wait.  Will not go to xray.  Provider informed.

## 2019-09-12 NOTE — ED Triage Notes (Signed)
Pt arrives with c/o right foot pain and blister on his 2nd toe of right foot. States he has been washing it daily and using hydrogen peroxide.  Pt states it hurts to walk on it, has not taken anything for pain.  Was seen earlier today in the ED but left AMA stating he had to pick up his g/f from work.  States he is willing to stay for treatment now "depending on how long it takes".

## 2019-09-12 NOTE — ED Triage Notes (Signed)
Pt c/o pain to R second toe x 5 days, pt toe has small abrasion to top  States he was taking a tent down and the pole from the tent bumped his toe

## 2019-09-12 NOTE — Progress Notes (Signed)
Contacted patient and reviewed results of culture. Patient left AMA.  Encouraged patient to return.  Patient states that he will come back for further eval and treatment.

## 2019-09-12 NOTE — ED Provider Notes (Signed)
38 year old male presents to [facility] emergency department with a right second painful toe. Reports that a tent pole came down onto his toe while taking it down 5 days ago. Patient states that he has had clear drainage from toe. States that it is an aching throb and difficulty to apply pressure. Reports that he is unsure of when his last tetanus was updated. Reports that he has not taken anything for pain and that he has not noted any fever or red streaks.            Past Medical History:   Diagnosis Date   ??? Psychiatric disorder        Past Surgical History:   Procedure Laterality Date   ??? HX BACK SURGERY           No family history on file.    Social History     Socioeconomic History   ??? Marital status: SINGLE     Spouse name: Not on file   ??? Number of children: Not on file   ??? Years of education: Not on file   ??? Highest education level: Not on file   Occupational History   ??? Not on file   Social Needs   ??? Financial resource strain: Not on file   ??? Food insecurity     Worry: Not on file     Inability: Not on file   ??? Transportation needs     Medical: Not on file     Non-medical: Not on file   Tobacco Use   ??? Smoking status: Current Every Day Smoker   ??? Smokeless tobacco: Never Used   Substance and Sexual Activity   ??? Alcohol use: Yes     Comment: rarely   ??? Drug use: Yes     Types: Marijuana   ??? Sexual activity: Not on file   Lifestyle   ??? Physical activity     Days per week: Not on file     Minutes per session: Not on file   ??? Stress: Not on file   Relationships   ??? Social Product manager on phone: Not on file     Gets together: Not on file     Attends religious service: Not on file     Active member of club or organization: Not on file     Attends meetings of clubs or organizations: Not on file     Relationship status: Not on file   ??? Intimate partner violence     Fear of current or ex partner: Not on file     Emotionally abused: Not on file     Physically abused: Not on file      Forced sexual activity: Not on file   Other Topics Concern   ??? Not on file   Social History Narrative   ??? Not on file         ALLERGIES: Amoxicillin    Review of Systems   Constitutional: Positive for activity change. Negative for appetite change and fever.   Musculoskeletal:        Right second toe pain   Skin: Positive for wound.   Neurological: Negative.        Vitals:    09/12/19 1523   BP: 132/77   Pulse: 76   Resp: 16   Temp: 97.5 ??F (36.4 ??C)   SpO2: 96%   Weight: 70.9 kg (156 lb 6.4 oz)   Height:  5\' 8"  (1.727 m)            Physical Exam  Vitals signs and nursing note reviewed.       GENERAL: well developed, well nourished, in no acute distress  FEET: right second toe with a 2 mm abrasion with slight pink surrounding and a clear serous drainage without warmth or ecchymosis. no gross deformities; tender to touch.  full passive and active range of motion of toes and ankle without crepitus; ankle and toe strength 5/5 bilaterally  VASCULAR: bilateral dorsalis pedis and posterior tibialis pulses 2+ symmetric bilaterally; toe capillary refill equal to or less than 2 seconds  NEUROLOGIC: sensation intact to light touch in distal digits          MDM  Number of Diagnoses or Management Options  Diagnosis management comments: Wound culture obtained.  Xray of toe ordered  Tetanus updated   Ibuprofen and tylenol ordered  It appears that his abrasion is infected, less likely involving the joint with mobility.    While I was in another patient room.  Patient reported to RN that he needed to leave right then and could not wait for work up or treatment and left AMA         Procedures      NIH Stroke Scale

## 2019-09-12 NOTE — ED Notes (Signed)
Pt declines to further this visit.  Has to leave, was in hallway trying to leave.  Informed provider.

## 2019-09-12 NOTE — Progress Notes (Signed)
Patient return to the ER after leaving AMA and was treated with doxycycline, he return to the ER today with the call to return as above, the toe is healing well.  Advised to continue to take the doxycycline with the culture showing susceptibility to tetracyclines.

## 2019-09-12 NOTE — Progress Notes (Signed)
Contacted patient and reviewed results of culture. Patient left AMA.  Encouraged patient to return.  Patient states that he will come back for further eval and treatment.

## 2019-09-12 NOTE — ED Provider Notes (Signed)
38 year old male presents to [facility] emergency department with a right second painful toe. Reports that a tent pole came down onto his toe while taking it down 5 days ago. Patient states that he has had clear drainage from toe. States that it is an aching throb and difficulty to apply pressure. Reports that he is unsure of when his last tetanus was updated. Reports that he has not taken anything for pain and that he has not noted any fever or red streaks.            Past Medical History:   Diagnosis Date   ??? Psychiatric disorder        Past Surgical History:   Procedure Laterality Date   ??? HX BACK SURGERY           No family history on file.    Social History     Socioeconomic History   ??? Marital status: SINGLE     Spouse name: Not on file   ??? Number of children: Not on file   ??? Years of education: Not on file   ??? Highest education level: Not on file   Occupational History   ??? Not on file   Social Needs   ??? Financial resource strain: Not on file   ??? Food insecurity     Worry: Not on file     Inability: Not on file   ??? Transportation needs     Medical: Not on file     Non-medical: Not on file   Tobacco Use   ??? Smoking status: Current Every Day Smoker   ??? Smokeless tobacco: Never Used   Substance and Sexual Activity   ??? Alcohol use: Yes     Comment: rarely   ??? Drug use: Yes     Types: Marijuana   ??? Sexual activity: Not on file   Lifestyle   ??? Physical activity     Days per week: Not on file     Minutes per session: Not on file   ??? Stress: Not on file   Relationships   ??? Social Product manager on phone: Not on file     Gets together: Not on file     Attends religious service: Not on file     Active member of club or organization: Not on file     Attends meetings of clubs or organizations: Not on file     Relationship status: Not on file   ??? Intimate partner violence     Fear of current or ex partner: Not on file     Emotionally abused: Not on file     Physically abused: Not on file     Forced sexual activity:  Not on file   Other Topics Concern   ??? Not on file   Social History Narrative   ??? Not on file         ALLERGIES: Amoxicillin    Review of Systems   Constitutional: Positive for activity change. Negative for appetite change and fever.   Musculoskeletal:        Right second toe pain   Skin: Positive for wound.   Neurological: Negative.        Vitals:    09/12/19 1523   BP: 132/77   Pulse: 76   Resp: 16   Temp: 97.5 ??F (36.4 ??C)   SpO2: 96%   Weight: 70.9 kg (156 lb 6.4 oz)   Height:  5\' 8"  (1.727 m)            Physical Exam  Vitals signs and nursing note reviewed.       GENERAL: well developed, well nourished, in no acute distress  FEET: right second toe with a 2 mm abrasion with slight pink surrounding and a clear serous drainage without warmth or ecchymosis. no gross deformities; tender to touch.  full passive and active range of motion of toes and ankle without crepitus; ankle and toe strength 5/5 bilaterally  VASCULAR: bilateral dorsalis pedis and posterior tibialis pulses 2+ symmetric bilaterally; toe capillary refill equal to or less than 2 seconds  NEUROLOGIC: sensation intact to light touch in distal digits          MDM  Number of Diagnoses or Management Options  Diagnosis management comments: Wound culture obtained.  Xray of toe ordered  Tetanus updated   Ibuprofen and tylenol ordered  It appears that his abrasion is infected, less likely involving the joint with mobility.    While I was in another patient room.  Patient reported to RN that he needed to leave right then and could not wait for work up or treatment and left AMA         Procedures      NIH Stroke Scale

## 2019-09-12 NOTE — ED Notes (Signed)
 Pt arrives with c/o right foot pain and blister on his 2nd toe of right foot. States he has been washing it daily and using hydrogen peroxide.  Pt states it hurts to walk on it, has not taken anything for pain.  Was seen earlier today in the ED but left AMA stating he had to pick up his g/f from work.  States he is willing to stay for treatment now depending on how long it takes.

## 2019-09-12 NOTE — ED Notes (Signed)
Pt declines xray, ibu and tyl.  Did accept Tdap  States he has to be somewhere by 1630, will not wait.  Will not go to xray.  Provider informed.

## 2019-09-12 NOTE — ED Notes (Signed)
Pt declines to further this visit.  Has to leave, was in hallway trying to leave.  Informed provider.

## 2019-09-12 NOTE — ED Notes (Signed)
Pt c/o pain to R second toe x 5 days, pt toe has small abrasion to top  States he was taking a tent down and the pole from the tent bumped his toe

## 2019-09-13 ENCOUNTER — Emergency Department: Admit: 2019-09-13 | Payer: MEDICARE | Primary: Physician Assistant

## 2019-09-13 ENCOUNTER — Inpatient Hospital Stay
Admit: 2019-09-13 | Discharge: 2019-09-13 | Disposition: A | Discharge status: Home / Self Care | Payer: MEDICARE | Attending: Emergency Medicine

## 2019-09-13 MED ORDER — DOXYCYCLINE HYCLATE 100 MG TAB
100 mg | ORAL_TABLET | Freq: Two times a day (BID) | ORAL | 0 refills | Status: AC
Start: 2019-09-13 — End: 2019-09-20

## 2019-09-13 MED ORDER — DOXYCYCLINE HYCLATE 100 MG TAB
100 mg | ORAL | Status: AC
Start: 2019-09-13 — End: 2019-09-13
  Administered 2019-09-13: 05:00:00 via ORAL

## 2019-09-13 MED FILL — DOXYCYCLINE HYCLATE 100 MG TAB: 100 mg | ORAL | Qty: 1

## 2019-09-13 NOTE — ED Notes (Signed)
Patient provided with verbal and paper discharge instructions. Patient verbalized understanding of discharge information with no additional questions. Medication changes reviewed as follows: doxycycline, prescription sent to pt's pharmacy. Patient alert, calm, and cooperative at time of discharge. VSS. Patient instructed to follow up/return if needed.  Patient left department via Ambulation with steady gait.

## 2019-09-13 NOTE — ED Provider Notes (Signed)
38 year old male presents to the ED for further evaluation of pain, swelling to his right 2nd toe.  Patient was seen and evaluated in this emergency department earlier today, the patient left after his initial physical examination prior to x-ray or medical treatment.  He did receive a tetanus vaccination update here.  Patient reports he had injured this right 2nd toe 5 days prior to evaluation.  He states he was doing temporary labor when a large tent pole fell onto his right foot.  The patient states he was wearing flip-flops at that time.  Immediate pain over the right foot, right 2nd toe.  He noted a small blister, erythema edema gradually forming over the last 2 to 3 days.  He denies any fevers, chills, streaking redness.  He denies a history of diabetes, neuropathy, peripheral vascular disease.  Denies a history recurrent skin infections, MRSA.  Denies other injuries.  Denies fevers, chills, rigors, new myalgias, arthralgias.             Past Medical History:   Diagnosis Date   ??? Psychiatric disorder        Past Surgical History:   Procedure Laterality Date   ??? HX BACK SURGERY           History reviewed. No pertinent family history.    Social History     Socioeconomic History   ??? Marital status: SINGLE     Spouse name: Not on file   ??? Number of children: Not on file   ??? Years of education: Not on file   ??? Highest education level: Not on file   Occupational History   ??? Not on file   Social Needs   ??? Financial resource strain: Not on file   ??? Food insecurity     Worry: Not on file     Inability: Not on file   ??? Transportation needs     Medical: Not on file     Non-medical: Not on file   Tobacco Use   ??? Smoking status: Current Every Day Smoker   ??? Smokeless tobacco: Never Used   Substance and Sexual Activity   ??? Alcohol use: Yes     Comment: rarely   ??? Drug use: Yes     Types: Marijuana   ??? Sexual activity: Not on file   Lifestyle   ??? Physical activity     Days per week: Not on file      Minutes per session: Not on file   ??? Stress: Not on file   Relationships   ??? Social Wellsite geologist on phone: Not on file     Gets together: Not on file     Attends religious service: Not on file     Active member of club or organization: Not on file     Attends meetings of clubs or organizations: Not on file     Relationship status: Not on file   ??? Intimate partner violence     Fear of current or ex partner: Not on file     Emotionally abused: Not on file     Physically abused: Not on file     Forced sexual activity: Not on file   Other Topics Concern   ??? Not on file   Social History Narrative   ??? Not on file         ALLERGIES: Amoxicillin    Review of Systems   Constitutional: Negative for fever.  Allergic/Immunologic: Negative for immunocompromised state.   Hematological: Negative for adenopathy. Does not bruise/bleed easily.       Vitals:    09/12/19 2335   BP: 134/77   Pulse: 76   Resp: 16   Temp: 98.4 ??F (36.9 ??C)   SpO2: 96%            Physical Exam  Vitals signs and nursing note reviewed.   Constitutional:       General: He is not in acute distress.     Appearance: Normal appearance. He is not ill-appearing, toxic-appearing or diaphoretic.   Musculoskeletal:        Feet:    Neurological:      Mental Status: He is alert.          MDM  Number of Diagnoses or Management Options  Diagnosis management comments: Presentation consistent with mild cellulitis after blunt trauma to the right 2nd toe.  Patient left prior to full evaluation, diagnostic testing including the x-ray.  There will be obtained at this time.  His tetanus vaccination was updated earlier today.  Afebrile, no evidence of significant erythema other than over the toe.  No evidence lymphangitis.  No bony deformities.  Patient is not immunocompromised.    Presentation is consistent with isolated cellulitis.  Patient will be placed on oral antibiotics.      Patient reports adverse reactions to amoxicillin, penicillin causing  nausea, flushing of the skin.  No angioedema.  He has no documented history of MRSA, recurrent skin infections.  Patient placed on oral doxycycline.       Amount and/or Complexity of Data Reviewed  Tests in the radiology section of CPT??: ordered and reviewed (X-ray right 2nd toe and adjacent toes:  No evidence of fracture, bony abnormalities, radiopaque foreign bodies.  )      ED Course as of Sep 13 27   Fri Sep 13, 2019   0027 XR 2ND TOE RT MIN 2 V [WC]      ED Course User Index  [WC] Theodoro Parma, MD       Procedures      NIH Stroke Scale

## 2019-09-13 NOTE — ED Provider Notes (Signed)
ED Provider Notes by Jonna Coup, MD at 09/13/19 0002                Author: Jonna Coup, MD  Service: --  Author Type: Physician       Filed: 09/13/19 0033  Date of Service: 09/13/19 0002  Status: Signed          Editor: Jonna Coup, MD (Physician)               38 year old male presents to the ED for further evaluation of pain, swelling to his right 2nd toe.   Patient was seen and evaluated in this emergency department earlier today, the patient left after his initial physical examination prior to x-ray or medical treatment.   He did receive a tetanus vaccination update here.   Patient reports he had injured this right 2nd toe 5 days prior to evaluation.  He states he was doing temporary labor when a large tent pole fell onto his right foot.  The patient states he was wearing flip-flops at that time.  Immediate pain over the  right foot, right 2nd toe.   He noted a small blister, erythema edema gradually forming over the last 2 to 3 days.  He denies any fevers, chills, streaking redness.  He denies a history of diabetes, neuropathy, peripheral vascular disease.   Denies a history recurrent skin infections, MRSA.   Denies other injuries.  Denies fevers, chills, rigors, new myalgias, arthralgias.                     Past Medical History:        Diagnosis  Date         ?  Psychiatric disorder               Past Surgical History:         Procedure  Laterality  Date          ?  HX BACK SURGERY                 History reviewed. No pertinent family history.        Social History          Socioeconomic History         ?  Marital status:  SINGLE              Spouse name:  Not on file         ?  Number of children:  Not on file     ?  Years of education:  Not on file     ?  Highest education level:  Not on file       Occupational History        ?  Not on file       Social Needs         ?  Financial resource strain:  Not on file        ?  Food insecurity              Worry:  Not on file          Inability:  Not on file        ?  Transportation needs              Medical:  Not on file         Non-medical:  Not on file       Tobacco Use         ?  Smoking status:  Current Every Day Smoker     ?  Smokeless tobacco:  Never Used       Substance and Sexual Activity         ?  Alcohol use:  Yes             Comment: rarely         ?  Drug use:  Yes              Types:  Marijuana         ?  Sexual activity:  Not on file       Lifestyle        ?  Physical activity              Days per week:  Not on file         Minutes per session:  Not on file         ?  Stress:  Not on file       Relationships        ?  Social Engineer, manufacturing systemsconnections              Talks on phone:  Not on file         Gets together:  Not on file         Attends religious service:  Not on file         Active member of club or organization:  Not on file         Attends meetings of clubs or organizations:  Not on file         Relationship status:  Not on file        ?  Intimate partner violence              Fear of current or ex partner:  Not on file         Emotionally abused:  Not on file         Physically abused:  Not on file         Forced sexual activity:  Not on file        Other Topics  Concern        ?  Not on file       Social History Narrative        ?  Not on file              ALLERGIES: Amoxicillin      Review of Systems    Constitutional: Negative for fever.    Allergic/Immunologic: Negative for immunocompromised state.    Hematological: Negative for adenopathy. Does not bruise/bleed easily.            Vitals:          09/12/19 2335        BP:  134/77     Pulse:  76     Resp:  16     Temp:  98.4 ??F (36.9 ??C)        SpO2:  96%                Physical Exam   Vitals signs and nursing note reviewed.   Constitutional:        General: He is not in acute distress.     Appearance: Normal appearance. He is not ill-appearing, toxic-appearing or diaphoretic.   Musculoskeletal :        Feet:  Neurological:       Mental Status: He is alert.              MDM   Number of Diagnoses or Management Options   Diagnosis management comments: Presentation consistent with mild cellulitis after blunt trauma to the right 2nd toe.   Patient left prior to full evaluation, diagnostic testing including the x-ray.  There will be obtained at this time.  His tetanus vaccination was updated earlier today.  Afebrile, no evidence of significant erythema other than over the toe.  No evidence  lymphangitis.  No bony deformities.  Patient is not immunocompromised.      Presentation is consistent with isolated cellulitis.  Patient will be placed on oral antibiotics.        Patient reports adverse reactions to amoxicillin, penicillin causing nausea, flushing of the skin.  No angioedema.  He has no documented history of MRSA, recurrent skin infections.  Patient placed on oral doxycycline.          Amount and/or Complexity of Data Reviewed   Tests in the radiology section of CPT??: ordered and reviewed  (X-ray right 2nd toe and adjacent toes:  No evidence of fracture, bony abnormalities, radiopaque foreign bodies.   )           ED Course as of Sep 13 27       Fri Sep 13, 2019        0027  XR 2ND TOE RT MIN 2 V [WC]              ED Course User Index   [WC] Theodoro Parma, MD           Procedures         NIH Stroke Scale

## 2019-09-13 NOTE — ED Notes (Signed)
Patient provided with verbal and paper discharge instructions. Patient verbalized understanding of discharge information with no additional questions. Medication changes reviewed as follows: doxycycline, prescription sent to pt's pharmacy. Patient alert, calm, and cooperative at time of discharge. VSS. Patient instructed to follow up/return if needed.  Patient left department via Ambulation with steady gait.

## 2019-09-15 ENCOUNTER — Inpatient Hospital Stay
Admit: 2019-09-15 | Discharge: 2019-09-15 | Disposition: A | Discharge status: Home / Self Care | Payer: MEDICARE | Attending: Physician Assistant

## 2019-09-15 DIAGNOSIS — Z5189 Encounter for other specified aftercare: Secondary | ICD-10-CM

## 2019-09-15 LAB — WOUND CULTURE

## 2019-09-15 NOTE — ED Triage Notes (Signed)
Seen 10/8 for infection right second toe. Taking antibitoic. Toe looks better per pt. No redness noted.

## 2019-09-15 NOTE — ED Notes (Signed)
Discharged by provider

## 2019-09-15 NOTE — ED Provider Notes (Signed)
This is a 38 yo male presenting to the ER for wound recheck.  He was seen here 10/8, diagnosed with a cellulitis of Right 2nd toe and left AMA.  He then returned here and was treated with Doxycycline antibiotic.  He was called today with a positive wound culture for MRSA and mistakenly told to come in for treatment as it was not known at the time that he had returned for treatment the same day he left AMA.    Today he reports his symptoms are significantly better, no longer has pain in the toe with less redness. States he has been taking the Doxycycline antibiotic regularly. No fevers, no questions.             Past Medical History:   Diagnosis Date   ??? Psychiatric disorder        Past Surgical History:   Procedure Laterality Date   ??? HX BACK SURGERY           No family history on file.    Social History     Socioeconomic History   ??? Marital status: SINGLE     Spouse name: Not on file   ??? Number of children: Not on file   ??? Years of education: Not on file   ??? Highest education level: Not on file   Occupational History   ??? Not on file   Social Needs   ??? Financial resource strain: Not on file   ??? Food insecurity     Worry: Not on file     Inability: Not on file   ??? Transportation needs     Medical: Not on file     Non-medical: Not on file   Tobacco Use   ??? Smoking status: Current Every Day Smoker   ??? Smokeless tobacco: Never Used   Substance and Sexual Activity   ??? Alcohol use: Yes     Comment: rarely   ??? Drug use: Yes     Types: Marijuana   ??? Sexual activity: Not on file   Lifestyle   ??? Physical activity     Days per week: Not on file     Minutes per session: Not on file   ??? Stress: Not on file   Relationships   ??? Social Product manager on phone: Not on file     Gets together: Not on file     Attends religious service: Not on file     Active member of club or organization: Not on file     Attends meetings of clubs or organizations: Not on file     Relationship status: Not on file   ??? Intimate partner violence      Fear of current or ex partner: Not on file     Emotionally abused: Not on file     Physically abused: Not on file     Forced sexual activity: Not on file   Other Topics Concern   ??? Not on file   Social History Narrative   ??? Not on file         ALLERGIES: Amoxicillin    Review of Systems   Constitutional: Negative for fatigue and fever.   Musculoskeletal:        See HPI, NO foot pain   Skin: Positive for wound.   Neurological: Negative for weakness and numbness.       Vitals:    09/15/19 1320   BP: (!) 126/55  Pulse: 77   Resp: 16   Temp: 98.6 ??F (37 ??C)   SpO2: 97%            Physical Exam     GENERAL: well developed, well nourished, in no acute distress  MSK: right second toe without erythema and a healing scabbed wound without drainage; no palpable tenderness, no abnormal warmth or crepitus.  Full active range of motion of toe  HEART: Right dorsalis pedis pulses 2+ regular  NEUROLOGIC: sensation intact to light touch in right distal digits          MDM  Number of Diagnoses or Management Options  Visit for wound check:   Diagnosis management comments: Patient afebrile, stable appearing with an intact neurovascular exam of right lower extremity and a well healing wound that is closed without signs of remaining cellulitis.  The infection was sensitive to Doxycyline; advised to finish the antibiotic course.    Advised on typical signs and symptoms warranting follow-up or emergency care, patient declined formal print out of verbal after visit summary instructions for recommended symptomatic care, follow up, and plan of care.  All patient's questions, concerns addressed and answered.            Procedures      NIH Stroke Scale

## 2019-09-15 NOTE — ED Notes (Signed)
Pt on stretcher in no acute distress. Reports that he continues on oral ABX. aftter taking abx for 3 days for right 2nd toe skin infection. Pt reports improvement since taking oral abx.

## 2019-09-15 NOTE — ED Notes (Signed)
Pt on stretcher in no acute distress. Reports that he continues on oral ABX. aftter taking abx for 3 days for right 2nd toe skin infection. Pt reports improvement since taking oral abx.

## 2019-09-15 NOTE — ED Notes (Signed)
Seen 10/8 for infection right second toe. Taking antibitoic. Toe looks better per pt. No redness noted.

## 2020-01-10 ENCOUNTER — Inpatient Hospital Stay
Admit: 2020-01-10 | Discharge: 2020-01-10 | Disposition: A | Discharge status: Home / Self Care | Payer: MEDICARE | Attending: Emergency Medicine

## 2020-01-10 DIAGNOSIS — J069 Acute upper respiratory infection, unspecified: Secondary | ICD-10-CM

## 2020-01-10 LAB — TRICHMONAS BY PCR
Trichomonas By PCR: NOT DETECTED
Trichomonas Source: NOT DETECTED
Trichomonas by PCR: NOT DETECTED
Trichomonas source: NOT DETECTED

## 2020-01-10 MED ORDER — FLUTICASONE 50 MCG/ACTUATION NASAL SPRAY, SUSP
50 mcg/actuation | Freq: Every day | NASAL | 0 refills | Status: AC
Start: 2020-01-10 — End: 2020-01-15

## 2020-01-10 MED ORDER — METRONIDAZOLE 500 MG TAB
500 mg | ORAL_TABLET | Freq: Two times a day (BID) | ORAL | 0 refills | Status: AC
Start: 2020-01-10 — End: 2020-01-17

## 2020-01-10 NOTE — ED Provider Notes (Signed)
This is a 39 year old male who is here with URI symptoms as well as exposure to Trichomonas.    History is obtained per the patient as well as review of his records.    He has a history of a previous back surgery as well as psychiatric disorder.  He does smoke.    Patient is here with his significant other who was recently diagnosed with her 10th pregnancy as well as Trichomonas and bacterial vaginosis.    Both the patient and his significant other states that they have been monogamous with each other for the past year.    Patient is here requesting a test for Trichomonas.  He has no urethral discharge or dysuria or sores in his genital area.    He also states that he does smoke.  He had noticed sinus congestion with a stuffy nose and a mild cough over the last week.  He has no fevers.  Denies any breathing difficulty.  He has had no ear pain, voice change, chest pain, belly pain.           Past Medical History:   Diagnosis Date   ??? Psychiatric disorder        Past Surgical History:   Procedure Laterality Date   ??? HX BACK SURGERY           History reviewed. No pertinent family history.    Social History     Socioeconomic History   ??? Marital status: SINGLE     Spouse name: Not on file   ??? Number of children: Not on file   ??? Years of education: Not on file   ??? Highest education level: Not on file   Occupational History   ??? Not on file   Social Needs   ??? Financial resource strain: Not on file   ??? Food insecurity     Worry: Not on file     Inability: Not on file   ??? Transportation needs     Medical: Not on file     Non-medical: Not on file   Tobacco Use   ??? Smoking status: Current Every Day Smoker   ??? Smokeless tobacco: Never Used   Substance and Sexual Activity   ??? Alcohol use: Yes     Comment: rarely   ??? Drug use: Yes     Types: Marijuana   ??? Sexual activity: Not on file   Lifestyle   ??? Physical activity     Days per week: Not on file     Minutes per session: Not on file   ??? Stress: Not on file   Relationships    ??? Social Product manager on phone: Not on file     Gets together: Not on file     Attends religious service: Not on file     Active member of club or organization: Not on file     Attends meetings of clubs or organizations: Not on file     Relationship status: Not on file   ??? Intimate partner violence     Fear of current or ex partner: Not on file     Emotionally abused: Not on file     Physically abused: Not on file     Forced sexual activity: Not on file   Other Topics Concern   ??? Not on file   Social History Narrative   ??? Not on file         ALLERGIES: Amoxicillin  Review of Systems   Constitutional: Negative for fever.   HENT: Positive for rhinorrhea, sinus pressure and sneezing. Negative for sore throat.    Respiratory: Positive for cough. Negative for shortness of breath.    Cardiovascular: Negative for chest pain.   Gastrointestinal: Negative for abdominal pain.   Genitourinary: Negative for dysuria.   Musculoskeletal: Negative for back pain.   Neurological: Negative for headaches.   Psychiatric/Behavioral: Negative for confusion.       Vitals:    01/10/20 0248   BP: 116/67   Pulse: 61   Resp: 16   Temp: 98.6 ??F (37 ??C)   SpO2: 96%            Physical Exam  Vitals signs and nursing note reviewed.   Constitutional:       Appearance: Normal appearance.   HENT:      Head: Normocephalic and atraumatic.      Right Ear: Tympanic membrane normal.      Left Ear: Tympanic membrane normal.      Nose: Nose normal.      Mouth/Throat:      Mouth: Mucous membranes are moist.      Pharynx: Oropharynx is clear.   Eyes:      Extraocular Movements: Extraocular movements intact.      Conjunctiva/sclera: Conjunctivae normal.      Pupils: Pupils are equal, round, and reactive to light.   Neck:      Musculoskeletal: Normal range of motion and neck supple.   Cardiovascular:      Rate and Rhythm: Normal rate and regular rhythm.      Pulses: Normal pulses.      Heart sounds: Normal heart sounds.   Pulmonary:       Effort: Pulmonary effort is normal.   Abdominal:      General: Abdomen is flat. Bowel sounds are normal.   Musculoskeletal: Normal range of motion.   Skin:     General: Skin is warm.      Capillary Refill: Capillary refill takes less than 2 seconds.   Neurological:      General: No focal deficit present.      Mental Status: He is alert.   Psychiatric:         Mood and Affect: Mood normal.          MDM  Number of Diagnoses or Management Options  Acute URI  Encounter for laboratory testing for COVID-19 virus  Trichomonas exposure  Diagnosis management comments: This is a 39 year old male who is here for Trichomonas exposure as well as URI.    His vital signs show no fever, tachycardia or hypotension.    His clinical exam shows an alert male in no distress.  He is able speak full sentences.  He has bilateral TMs clear.  There is no sinus tenderness.  Oropharynx is well-hydrated.  He has no cervical lymphadenopathy.  He has no stridor.  His lungs are clear with no retractions or use of accessory muscles.  He has no murmurs.  His abdomen is soft.    Patient has Trichomonas pending.    Because of his exposure I will treat him empirically with metronidazole for 1 week.  There is a COVID test pending.  He is not in respiratory distress.  He is not hypoxic.  I think he is stable for outpatient management.  He is cautioned about smoking.  I will put him on Flonase nasal spray for the next 5 days.  Patient is  discharged follow with his PCP.  He is given the phone number to [facility] referral to get a PCP via does not have 1.    I did review his significant other's test results.  She had positive Trichomonas and bacterial vaginosis.  She did have a GC/chlamydia that was negative.         Amount and/or Complexity of Data Reviewed  Clinical lab tests: ordered and reviewed (Labs Reviewed  TRICHMONAS BY PCR  SARS-COV-2  )    Risk of Complications, Morbidity, and/or Mortality  Presenting problems: moderate   Diagnostic procedures: moderate  Management options: moderate    Patient Progress  Patient progress: stable         Procedures      NIH Stroke Scale

## 2020-01-10 NOTE — ED Triage Notes (Addendum)
Pt presents for an STD check up for  trichomonas as pt's s/o was recently diagnosed.  States his sinuses are also congested, he took day quick earlier with no relief.  Reports a cough, both of these have been on-going for "months".  Pt does not have a PCP. Denies any COVID contacts, fevers, N/V/D.  Pt is able to smell and taste without difficulties.

## 2020-01-10 NOTE — ED Notes (Signed)
Patient and s/o provided with verbal and paper discharge instructions. Patient and s/o verbalized understanding of discharge information with no additional questions. Medication changes reviewed as follows: flonase 2 sprays both nostrils daily for 5 days.  Flagyl 500mg 1 tab BID x7days.  Pt given PCP referral line contact to establish primary care. Patient alert, calm, and cooperative at time of discharge. VSS. Patient instructed to follow up/return if needed.  Patient left department via Ambulation with steady gait.

## 2020-01-10 NOTE — ED Provider Notes (Signed)
This is a 39 year old male who is here with URI symptoms as well as exposure to Trichomonas.    History is obtained per the patient as well as review of his records.    He has a history of a previous back surgery as well as psychiatric disorder.  He does smoke.    Patient is here with his significant other who was recently diagnosed with her 10th pregnancy as well as Trichomonas and bacterial vaginosis.    Both the patient and his significant other states that they have been monogamous with each other for the past year.    Patient is here requesting a test for Trichomonas.  He has no urethral discharge or dysuria or sores in his genital area.    He also states that he does smoke.  He had noticed sinus congestion with a stuffy nose and a mild cough over the last week.  He has no fevers.  Denies any breathing difficulty.  He has had no ear pain, voice change, chest pain, belly pain.           Past Medical History:   Diagnosis Date   ??? Psychiatric disorder        Past Surgical History:   Procedure Laterality Date   ??? HX BACK SURGERY           History reviewed. No pertinent family history.    Social History     Socioeconomic History   ??? Marital status: SINGLE     Spouse name: Not on file   ??? Number of children: Not on file   ??? Years of education: Not on file   ??? Highest education level: Not on file   Occupational History   ??? Not on file   Social Needs   ??? Financial resource strain: Not on file   ??? Food insecurity     Worry: Not on file     Inability: Not on file   ??? Transportation needs     Medical: Not on file     Non-medical: Not on file   Tobacco Use   ??? Smoking status: Current Every Day Smoker   ??? Smokeless tobacco: Never Used   Substance and Sexual Activity   ??? Alcohol use: Yes     Comment: rarely   ??? Drug use: Yes     Types: Marijuana   ??? Sexual activity: Not on file   Lifestyle   ??? Physical activity     Days per week: Not on file     Minutes per session: Not on file   ??? Stress: Not on file   Relationships   ???  Social Product manager on phone: Not on file     Gets together: Not on file     Attends religious service: Not on file     Active member of club or organization: Not on file     Attends meetings of clubs or organizations: Not on file     Relationship status: Not on file   ??? Intimate partner violence     Fear of current or ex partner: Not on file     Emotionally abused: Not on file     Physically abused: Not on file     Forced sexual activity: Not on file   Other Topics Concern   ??? Not on file   Social History Narrative   ??? Not on file         ALLERGIES: Amoxicillin  Review of Systems   Constitutional: Negative for fever.   HENT: Positive for rhinorrhea, sinus pressure and sneezing. Negative for sore throat.    Respiratory: Positive for cough. Negative for shortness of breath.    Cardiovascular: Negative for chest pain.   Gastrointestinal: Negative for abdominal pain.   Genitourinary: Negative for dysuria.   Musculoskeletal: Negative for back pain.   Neurological: Negative for headaches.   Psychiatric/Behavioral: Negative for confusion.       Vitals:    01/10/20 0248   BP: 116/67   Pulse: 61   Resp: 16   Temp: 98.6 ??F (37 ??C)   SpO2: 96%            Physical Exam  Vitals signs and nursing note reviewed.   Constitutional:       Appearance: Normal appearance.   HENT:      Head: Normocephalic and atraumatic.      Right Ear: Tympanic membrane normal.      Left Ear: Tympanic membrane normal.      Nose: Nose normal.      Mouth/Throat:      Mouth: Mucous membranes are moist.      Pharynx: Oropharynx is clear.   Eyes:      Extraocular Movements: Extraocular movements intact.      Conjunctiva/sclera: Conjunctivae normal.      Pupils: Pupils are equal, round, and reactive to light.   Neck:      Musculoskeletal: Normal range of motion and neck supple.   Cardiovascular:      Rate and Rhythm: Normal rate and regular rhythm.      Pulses: Normal pulses.      Heart sounds: Normal heart sounds.   Pulmonary:      Effort:  Pulmonary effort is normal.   Abdominal:      General: Abdomen is flat. Bowel sounds are normal.   Musculoskeletal: Normal range of motion.   Skin:     General: Skin is warm.      Capillary Refill: Capillary refill takes less than 2 seconds.   Neurological:      General: No focal deficit present.      Mental Status: He is alert.   Psychiatric:         Mood and Affect: Mood normal.          MDM  Number of Diagnoses or Management Options  Acute URI  Encounter for laboratory testing for COVID-19 virus  Trichomonas exposure  Diagnosis management comments: This is a 39 year old male who is here for Trichomonas exposure as well as URI.    His vital signs show no fever, tachycardia or hypotension.    His clinical exam shows an alert male in no distress.  He is able speak full sentences.  He has bilateral TMs clear.  There is no sinus tenderness.  Oropharynx is well-hydrated.  He has no cervical lymphadenopathy.  He has no stridor.  His lungs are clear with no retractions or use of accessory muscles.  He has no murmurs.  His abdomen is soft.    Patient has Trichomonas pending.    Because of his exposure I will treat him empirically with metronidazole for 1 week.  There is a COVID test pending.  He is not in respiratory distress.  He is not hypoxic.  I think he is stable for outpatient management.  He is cautioned about smoking.  I will put him on Flonase nasal spray for the next 5 days.  Patient is  discharged follow with his PCP.  He is given the phone number to [facility] referral to get a PCP via does not have 1.    I did review his significant other's test results.  She had positive Trichomonas and bacterial vaginosis.  She did have a GC/chlamydia that was negative.         Amount and/or Complexity of Data Reviewed  Clinical lab tests: ordered and reviewed (Labs Reviewed  TRICHMONAS BY PCR  SARS-COV-2  )    Risk of Complications, Morbidity, and/or Mortality  Presenting problems: moderate  Diagnostic procedures:  moderate  Management options: moderate    Patient Progress  Patient progress: stable         Procedures      NIH Stroke Scale

## 2020-01-10 NOTE — ED Notes (Signed)
Patient and s/o provided with verbal and paper discharge instructions. Patient and s/o verbalized understanding of discharge information with no additional questions. Medication changes reviewed as follows: flonase 2 sprays both nostrils daily for 5 days.  Flagyl 500mg  1 tab BID x7days.  Pt given PCP referral line contact to establish primary care. Patient alert, calm, and cooperative at time of discharge. VSS. Patient instructed to follow up/return if needed.  Patient left department via Ambulation with steady gait.

## 2020-01-10 NOTE — ED Notes (Signed)
Pt presents for an STD check up for  trichomonas as pt's s/o was recently diagnosed.  States his sinuses are also congested, he took day quick earlier with no relief.  Reports a cough, both of these have been on-going for "months".  Pt does not have a PCP. Denies any COVID contacts, fevers, N/V/D.  Pt is able to smell and taste without difficulties.

## 2020-03-19 NOTE — Telephone Encounter (Signed)
Pt name and DOB verified.    Who is your current or most recent primary care provider and   which hospital were they affiliated with? Pt stated he has never had one    How long did you see them for? n/a    Is there a reason that you are looking for a new provider? Going for disability     Would you like to provide your e-mail to sign up for out My Chart system? no  The benefits of being a My Chart member are:    -Request medical appointments  -View your health summary from the My Chart electronic health record.  -View test results.  -Request prescription renewals.  -Access trusted health information resources.  -Communicate electronically and securely with your medical care team.    Would you like a test message or letter with your activation code? mail    Do you have a preference about the provider you will see? male    When was your last Annual or Well Child Exam? July 2019    Do you have any ongoing or chronic conditions you are currently being treated   for (e.g. Hypertension, Diabetes, Depression, etc)? If yes list: bipolar, anxiety and depression    Have you been seen by a provider for these chronic conditions in the last 6 months? If so, who? Yes     Do you need an appointment to establish care or is there something more   urgent you need to be seen for? Explain: disability     Are you on any medicines that require a special prescription such as a sleeping pills, pain medication or stimulants? (if no go ahead and book appointment, if yes please read the following: yes    "Please be aware that our provider's may not prescribe these medications at the first visit as they need to get to know more about you and your medical problems/history before they will safely prescribe these" Inforemd? Yes or No yes    Do you have any questions about what we discussed? no  (If no go ahead and book, if yes please get the details and send to the practice)    Your new patient appointment is booked on 4/20 at 9:30am with DALLAS  MARQUIS.    **Thank you for choosing [place] for your health care needs.  Mr. Akshay Spang I do want to let you know that you will need to contact your insurance company and make them aware of your new Primary Care Provider which is None.   Also Mr. Jentzen Minasyan a new patient packet will be coming to you. Would you like to have this mailed or faxed to you? MAIL If faxed what is the number? MAIL  We would greatly appreciate it if you could complete the release of records and either mail back or drop off at out office within 7 days of receipt.    Notes: NONE    CB#: [phone] (home)     Terry Hartman

## 2020-03-20 NOTE — Telephone Encounter (Signed)
Noted, np letter sent    Terry Hartman

## 2020-03-24 ENCOUNTER — Ambulatory Visit: Attending: Physician Assistant | Primary: Physician Assistant

## 2020-03-24 ENCOUNTER — Ambulatory Visit
Admit: 2020-03-24 | Discharge: 2020-03-24 | Payer: MEDICARE | Attending: Physician Assistant | Primary: Physician Assistant

## 2020-03-24 DIAGNOSIS — F3112 Bipolar disorder, current episode manic without psychotic features, moderate: Secondary | ICD-10-CM

## 2020-03-24 MED ORDER — QUETIAPINE 50 MG TAB
50 mg | ORAL_TABLET | Freq: Every evening | ORAL | 5 refills | Status: AC | PRN
Start: 2020-03-24 — End: ?

## 2020-03-24 NOTE — Addendum Note (Signed)
Addended by: Elisandro Jarrett M on: 04/03/2020 08:32 AM     Modules accepted: Orders

## 2020-03-24 NOTE — Addendum Note (Signed)
Addended by: Michell Heinrich on: 04/03/2020 08:33 AM     Modules accepted: Orders

## 2020-03-24 NOTE — Addendum Note (Signed)
Addended by: Dena Esperanza M on: 04/03/2020 08:32 AM     Modules accepted: Orders

## 2020-03-24 NOTE — Progress Notes (Signed)
HISTORY OF PRESENT ILLNESS  Terry Hartman is a 39 y.o. male.  HPI  39 yo male is new to me and to the practice. His last PCP was in [place], last visit 5 years ago.    Moved to [place] in 2019, used to vacation in 1998, he wanted to live in a quieter place and safer.    History of ADHD diagnosis in 1980s, was on ritalin for a few years, but notes he was told misdiagnosis and was advised as Bipolar, he has taken multiple psych medications, most recently lamictal and seroquel. He notes last lamictal dosage was 2018 - didn't like side effects of irritability, notes he feels paranoia and anxiety daily, he uses seroquel a few times a week and it is helpful with sleep.    He notes racing thoughts, difficulty with sleep, he has too many thoughts and would like to slow his brain down. He has difficulty in the AM with motivation and energy when seroquel used the night before. He has depression and anxiety. Denies SI/HI.    Has tried lithium, depakote, and zyprexa, xanax, klonopin.  ROS  See HPI  Physical Exam  Constitutional:       Appearance: He is normal weight.   HENT:      Head: Normocephalic.   Eyes:      Conjunctiva/sclera: Conjunctivae normal.      Pupils: Pupils are equal, round, and reactive to light.   Pulmonary:      Effort: Pulmonary effort is normal.   Skin:     General: Skin is warm and dry.   Neurological:      Mental Status: He is alert.   Psychiatric:         Mood and Affect: Mood normal.         Behavior: Behavior normal.         Thought Content: Thought content normal.         ASSESSMENT and PLAN    ICD-10-CM ICD-9-CM    1. Bipolar 1 disorder, manic, moderate (HCC)   Reviewed with patient referrals and medications options, due to mania and failure of multiple medications, will hold on SSRI, history of intermittent manic episodes, pt is agreeable to [facility] psych and counseling referral. He notes panic attacks occur, rarely a time or two a month, f/u in 1 month to monitor how he is doing and obtain labs.  F31.12 296.42 REFERRAL TO PSYCHIATRY      REFERRAL TO BEHAVIORAL HEALTH

## 2020-03-24 NOTE — Addendum Note (Signed)
Addended by: Michell Heinrich on: 04/03/2020 08:32 AM     Modules accepted: Orders

## 2020-03-24 NOTE — Addendum Note (Signed)
Addended by: Klara Stjames M on: 04/03/2020 08:32 AM     Modules accepted: Orders

## 2020-03-24 NOTE — Addendum Note (Signed)
Addended by: Dazia Lippold M on: 04/03/2020 08:33 AM     Modules accepted: Orders

## 2020-03-24 NOTE — Addendum Note (Signed)
Addendum Note by Michell Heinrich, CMA at 03/24/20 0930                Author: Michell Heinrich, CMA  Service: --  Author Type: Medical Assistant       Filed: 04/03/20 [phone]  Encounter Date: 03/24/2020  Status: Signed          Editor: Michell Heinrich, CMA (Medical Assistant)          Addended by: Michell Heinrich on: 04/03/2020 08:33 AM    Modules accepted: Orders

## 2020-03-24 NOTE — Progress Notes (Signed)
HISTORY OF PRESENT ILLNESS  Ennio Houp is a 39 y.o. male.  HPI  39 yo male is new to me and to the practice. His last PCP was in [place], last visit 5 years ago.    Moved to [place] in 2019, used to vacation in 1998, he wanted to live in a quieter place and safer.    History of ADHD diagnosis in 1980s, was on ritalin for a few years, but notes he was told misdiagnosis and was advised as Bipolar, he has taken multiple psych medications, most recently lamictal and seroquel. He notes last lamictal dosage was 2018 - didn't like side effects of irritability, notes he feels paranoia and anxiety daily, he uses seroquel a few times a week and it is helpful with sleep.    He notes racing thoughts, difficulty with sleep, he has too many thoughts and would like to slow his brain down. He has difficulty in the AM with motivation and energy when seroquel used the night before. He has depression and anxiety. Denies SI/HI.    Has tried lithium, depakote, and zyprexa, xanax, klonopin.  ROS  See HPI  Physical Exam  Constitutional:       Appearance: He is normal weight.   HENT:      Head: Normocephalic.   Eyes:      Conjunctiva/sclera: Conjunctivae normal.      Pupils: Pupils are equal, round, and reactive to light.   Pulmonary:      Effort: Pulmonary effort is normal.   Skin:     General: Skin is warm and dry.   Neurological:      Mental Status: He is alert.   Psychiatric:         Mood and Affect: Mood normal.         Behavior: Behavior normal.         Thought Content: Thought content normal.         ASSESSMENT and PLAN    ICD-10-CM ICD-9-CM    1. Bipolar 1 disorder, manic, moderate (HCC)   Reviewed with patient referrals and medications options, due to mania and failure of multiple medications, will hold on SSRI, history of intermittent manic episodes, pt is agreeable to [facility] psych and counseling referral. He notes panic attacks occur, rarely a time or two a month, f/u in 1 month to monitor how he is doing and obtain labs.  F31.12 296.42 REFERRAL TO PSYCHIATRY      REFERRAL TO BEHAVIORAL HEALTH

## 2020-03-24 NOTE — Addendum Note (Signed)
Addendum Note by Wright, Sarah M, CMA at 03/24/20 0930                Author: Wright, Sarah M, CMA  Service: --  Author Type: Medical Assistant       Filed: 04/03/20 0832  Encounter Date: 03/24/2020  Status: Signed          Editor: Wright, Sarah M, CMA (Medical Assistant)          Addended by: WRIGHT, SARAH M on: 04/03/2020 08:32 AM    Modules accepted: Orders

## 2020-03-24 NOTE — Addendum Note (Signed)
Addendum Note by Michell Heinrich, CMA at 03/24/20 0930                Author: Michell Heinrich, CMA  Service: --  Author Type: Medical Assistant       Filed: 04/03/20 [phone]  Encounter Date: 03/24/2020  Status: Signed          Editor: Michell Heinrich, CMA (Medical Assistant)          Addended by: Michell Heinrich on: 04/03/2020 08:32 AM    Modules accepted: Orders

## 2020-03-24 NOTE — Addendum Note (Signed)
Addendum Note by Michell Heinrich, CMA at 03/24/20 0930                Author: Michell Heinrich, CMA  Service: --  Author Type: Medical Assistant       Filed: 04/03/20 [phone]  Encounter Date: 03/24/2020  Status: Signed          Editor: Michell Heinrich, CMA (Medical Assistant)          Addended by: Michell Heinrich on: 04/03/2020 08:32 AM    Modules accepted: Orders

## 2020-03-24 NOTE — Addendum Note (Signed)
Addendum Note by Wright, Sarah M, CMA at 03/24/20 0930                Author: Wright, Sarah M, CMA  Service: --  Author Type: Medical Assistant       Filed: 04/03/20 0833  Encounter Date: 03/24/2020  Status: Signed          Editor: Wright, Sarah M, CMA (Medical Assistant)          Addended by: WRIGHT, SARAH M on: 04/03/2020 08:33 AM    Modules accepted: Orders

## 2020-04-03 ENCOUNTER — Inpatient Hospital Stay
Admit: 2020-04-03 | Discharge: 2020-04-03 | Disposition: A | Discharge status: Home / Self Care | Payer: MEDICARE | Attending: Family

## 2020-04-03 ENCOUNTER — Inpatient Hospital Stay: Admit: 2020-04-03 | Payer: MEDICARE | Primary: Physician Assistant

## 2020-04-03 ENCOUNTER — Institutional Professional Consult (permissible substitution): Admit: 2020-04-03 | Discharge: 2020-04-03 | Payer: MEDICARE | Primary: Physician Assistant

## 2020-04-03 DIAGNOSIS — F3112 Bipolar disorder, current episode manic without psychotic features, moderate: Secondary | ICD-10-CM

## 2020-04-03 DIAGNOSIS — B356 Tinea cruris: Secondary | ICD-10-CM

## 2020-04-03 LAB — CBC WITH AUTOMATED DIFF
ABS. BASOPHILS: 0.1 10*3/uL (ref 0.0–0.1)
ABS. EOSINOPHILS: 0.4 10*3/uL (ref 0.0–0.5)
ABS. IMM. GRANS.: 0.1 10*3/uL — ABNORMAL HIGH (ref 0.00–0.04)
ABS. LYMPHOCYTES: 2.4 10*3/uL (ref 1.3–3.6)
ABS. MONOCYTES: 0.8 10*3/uL (ref 0.3–0.8)
ABS. NEUTROPHILS: 4.7 10*3/uL (ref 1.6–6.1)
ABSOLUTE NRBC: 0 10*3/uL
BASOPHILS: 1 % (ref 0–2)
EOSINOPHILS: 4 % (ref 0–5)
HCT: 45.2 % (ref 42.0–52.0)
HGB: 15.3 g/dL (ref 14.0–18.0)
IMMATURE GRANULOCYTES: 1 % — ABNORMAL HIGH (ref 0.0–0.6)
LYMPHOCYTES: 28 % (ref 14–46)
MCH: 30.4 PG (ref 27.0–31.0)
MCHC: 33.8 g/dL (ref 33.0–37.0)
MCV: 89.9 FL (ref 80.0–94.0)
MONOCYTES: 10 % (ref 5–12)
MPV: 10.7 FL — ABNORMAL HIGH (ref 7.4–10.4)
NEUTROPHILS: 56 % (ref 47–80)
NRBC: 0 PER 100 WBC
PLATELET: 215 10*3/uL (ref 130–400)
RBC: 5.03 M/uL (ref 4.70–6.10)
RDW: 13.2 % (ref 11.5–14.5)
WBC: 8.4 10*3/uL (ref 4.5–10.9)

## 2020-04-03 LAB — METABOLIC PANEL, COMPREHENSIVE
ALT (SGPT): 44 U/L (ref 12–78)
AST (SGOT): 21 U/L (ref 10–37)
Albumin: 3.8 g/dL (ref 3.4–5.0)
Alk. phosphatase: 90 U/L (ref 43–117)
BUN: 22 MG/DL (ref 7–22)
Bilirubin, total: 0.8 mg/dL (ref 0.00–1.00)
CO2: 29 mmol/L (ref 21–32)
Calcium: 9.1 MG/DL (ref 8.5–10.1)
Chloride: 105 mmol/L (ref 98–108)
Creatinine: 0.74 MG/DL (ref 0.70–1.30)
GFR est AA: >60 mL/min/{1.73_m2} (ref >60)
GFR est non-AA: >60 mL/min/{1.73_m2} (ref >60)
Glucose: 124 mg/dL — ABNORMAL HIGH (ref 74–106)
Potassium: 4 mmol/L (ref 3.4–5.1)
Protein, total: 7.7 g/dL (ref 6.4–8.2)
Sodium: 137 mmol/L (ref 136–145)

## 2020-04-03 LAB — LIPID PANEL W/ REFLX DIRECT LDL
CHOL/HDL Ratio: 3.2
Cholesterol, total: 190 MG/DL (ref 0–199)
HDL Cholesterol: 60 MG/DL (ref >40)
LDL, calculated: 112 MG/DL
N-HDL-C: 130 mg/dL
Triglyceride: 90 MG/DL (ref <150)

## 2020-04-03 LAB — TSH CASCADE: TSH, 3rd generation: 1.68 u[IU]/mL (ref 0.358–3.740)

## 2020-04-03 LAB — COMPREHENSIVE METABOLIC PANEL
ALT: 44 U/L (ref 12–78)
AST: 21 U/L (ref 10–37)
Albumin: 3.8 g/dL (ref 3.4–5.0)
Alkaline Phosphatase: 90 U/L (ref 43–117)
BUN: 22 MG/DL (ref 7–22)
CO2: 29 mmol/L (ref 21–32)
Calcium: 9.1 MG/DL (ref 8.5–10.1)
Chloride: 105 mmol/L (ref 98–108)
Creatinine: 0.74 MG/DL (ref 0.70–1.30)
GFR African American: >60 mL/min/{1.73_m2} (ref >60)
Glucose: 124 mg/dL — ABNORMAL HIGH (ref 74–106)
Potassium: 4 mmol/L (ref 3.4–5.1)
Sodium: 137 mmol/L (ref 136–145)
Total Bilirubin: 0.8 mg/dL (ref 0.00–1.00)
Total Protein: 7.7 g/dL (ref 6.4–8.2)
eGFR NON-AA: >60 mL/min/{1.73_m2} (ref >60)

## 2020-04-03 LAB — CBC WITH AUTO DIFFERENTIAL
Basophils %: 1 % (ref 0–2)
Basophils Absolute: 0.1 10*3/uL (ref 0.0–0.1)
Eosinophils %: 4 % (ref 0–5)
Eosinophils Absolute: 0.4 10*3/uL (ref 0.0–0.5)
Granulocyte Absolute Count: 0.1 10*3/uL — ABNORMAL HIGH (ref 0.00–0.04)
Hematocrit: 45.2 % (ref 42.0–52.0)
Hemoglobin: 15.3 g/dL (ref 14.0–18.0)
Immature Granulocytes %: 1 % — ABNORMAL HIGH (ref 0.0–0.6)
Lymphocytes %: 28 % (ref 14–46)
Lymphocytes Absolute: 2.4 10*3/uL (ref 1.3–3.6)
MCH: 30.4 PG (ref 27.0–31.0)
MCHC: 33.8 g/dL (ref 33.0–37.0)
MCV: 89.9 FL (ref 80.0–94.0)
MPV: 10.7 FL — ABNORMAL HIGH (ref 7.4–10.4)
Monocytes %: 10 % (ref 5–12)
Monocytes Absolute: 0.8 10*3/uL (ref 0.3–0.8)
NRBC Absolute: 0 10*3/uL
Neutrophils %: 56 % (ref 47–80)
Neutrophils Absolute: 4.7 10*3/uL (ref 1.6–6.1)
Nucleated RBCs: 0 PER 100 WBC
Platelets: 215 10*3/uL (ref 130–400)
RBC: 5.03 M/uL (ref 4.70–6.10)
RDW: 13.2 % (ref 11.5–14.5)
WBC: 8.4 10*3/uL (ref 4.5–10.9)

## 2020-04-03 LAB — THYROID CASCADE PROFILE: TSH, 3rd Generation: 1.68 u[IU]/mL (ref 0.358–3.740)

## 2020-04-03 LAB — LIPID PANEL W/ REFLEX DIRECT LDL
Chol/HDL Ratio: 3.2 NA
Cholesterol, Total: 190 MG/DL (ref 0–199)
HDL: 60 MG/DL (ref >40)
LDL Calculated: 112 MG/DL
Non HDL Chol. (LDL+VLDL): 130 mg/dL
Triglycerides: 90 MG/DL (ref <150)

## 2020-04-03 MED ORDER — FLUCONAZOLE 150 MG TAB
150 mg | ORAL_TABLET | ORAL | 0 refills | Status: AC
Start: 2020-04-03 — End: 2020-04-03

## 2020-04-03 MED ORDER — CLOTRIMAZOLE 1 % TOPICAL CREAM
1 % | Freq: Two times a day (BID) | CUTANEOUS | 0 refills | Status: AC
Start: 2020-04-03 — End: 2020-04-18

## 2020-04-03 NOTE — Other (Signed)
Patient is here with burning, dry, red testicles x4 days.   Patients wife has a yeast infection he believes he caught from her.

## 2020-04-03 NOTE — Other (Signed)
39-year-old male presents to Urgent Care complaining of rash to his scrotal area.  He reports approximately 4 days ago noted a red rash that was pruritic. He states has tried topical anti-itch cream which helps PCP but symptoms are persistent.  Reports concern for fungal infection as his partner recently notified him that she tested positive for a vaginal yeast infection.  Denies any dysuria hematuria, penile discharge, testicular pain or swelling.    The history is provided by the patient.   Rash          Past Medical History:   Diagnosis Date   ??? Psychiatric disorder         Past Surgical History:   Procedure Laterality Date   ??? HX BACK SURGERY           History reviewed. No pertinent family history.     Social History     Socioeconomic History   ??? Marital status: SINGLE     Spouse name: Not on file   ??? Number of children: Not on file   ??? Years of education: Not on file   ??? Highest education level: Not on file   Occupational History   ??? Not on file   Social Needs   ??? Financial resource strain: Not on file   ??? Food insecurity     Worry: Not on file     Inability: Not on file   ??? Transportation needs     Medical: Not on file     Non-medical: Not on file   Tobacco Use   ??? Smoking status: Former Smoker     Packs/day: 0.50     Years: 15.00     Pack years: 7.50     Types: Cigarettes, Pipe, Cigars     Start date: 12/05/1998     Quit date: 12/05/2013     Years since quitting: 6.3   ??? Smokeless tobacco: Never Used   Substance and Sexual Activity   ??? Alcohol use: Yes     Comment: rarely   ??? Drug use: Not Currently     Types: Marijuana   ??? Sexual activity: Not on file   Lifestyle   ??? Physical activity     Days per week: Not on file     Minutes per session: Not on file   ??? Stress: Not on file   Relationships   ??? Social connections     Talks on phone: Not on file     Gets together: Not on file     Attends religious service: Not on file     Active member of club or organization: Not on file     Attends meetings of clubs or  organizations: Not on file     Relationship status: Not on file   ??? Intimate partner violence     Fear of current or ex partner: Not on file     Emotionally abused: Not on file     Physically abused: Not on file     Forced sexual activity: Not on file   Other Topics Concern   ??? Not on file   Social History Narrative   ??? Not on file                ALLERGIES: Amoxicillin    Review of Systems   Constitutional: Negative.    Respiratory: Negative.    Cardiovascular: Negative.    Gastrointestinal: Negative.    Genitourinary:          Scrotal rash   Skin: Positive for rash.   Neurological: Negative.        Vitals:    04/03/20 0953   BP: 122/83   Pulse: 71   Resp: 17   Temp: 98.2 ??F (36.8 ??C)   SpO2: 97%       Physical Exam  Vitals signs and nursing note reviewed.       GENERAL: well-developed, well-nourished, in no distress, calm and cooperative  HEAD: atraumatic, normocephalic  NEURO: Alert and oriented x3, GCS 15, motor and sensory grossly intact  LUNGS: clear to auscultation bilaterally, no wheezes, rales or rhonchi; respirations even and unlabored  HEART: regular rate and rhythm;  immediate capillary refill, palpable strong bilateral radial pulses  ABDOMEN: Soft, nontender, nondistended, no CVA tenderness  GU:  Erythematous slightly scaly rash to anterior and inferior aspect of scrotum with no induration, fluctuance, or drainage.  No testicular tenderness or swelling. No penile discharge.  No other skin rashes or lesions.        MDM  Number of Diagnoses or Management Options  Candidal dermatitis  Tinea cruris  Diagnosis management comments: Patient in no distress, well-appearing.  Presentation physical exam findings suggestive of rash in fungal etiology.  No indication of cellulitis, herpes simplex, or other complication or emergent etiology.  Will prescribe topical antifungal and dose of Diflucan.  Patient appears stable for discharge and verbalized understanding of home care, follow-up, and return precautions.                Procedures

## 2020-04-03 NOTE — Progress Notes (Signed)
Lab(s) Drawn:   Lipid, CMP, TSH, CBC  Drawn at [facility]  Needle used:  [] 21 Ga  [x] 23 Ga  [] 25 Ga  Butterfly  Sample obtained from:     [x] Right arm    [] Left arm  [] Right hand  [] Left hand     Performed by: Verenise Moulin M Leidy Massar, CMA

## 2020-04-03 NOTE — Progress Notes (Signed)
Lab(s) Drawn:   Lipid, CMP, TSH, CBC  Drawn at [facility]  Needle used:  []  21 Ga  [x]  23 Ga  []  25 Ga  Butterfly  Sample obtained from:     [x]  Right arm    []  Left arm  []  Right hand  []  Left hand     Performed by: , CMA

## 2020-04-03 NOTE — ED Notes (Signed)
Patient is here with burning, dry, red testicles x4 days.   Patients wife has a yeast infection he believes he caught from her.

## 2020-04-03 NOTE — ED Provider Notes (Signed)
39 year old male presents to Urgent Care complaining of rash to his scrotal area.  He reports approximately 4 days ago noted a red rash that was pruritic. He states has tried topical anti-itch cream which helps PCP but symptoms are persistent.  Reports concern for fungal infection as his partner recently notified him that she tested positive for a vaginal yeast infection.  Denies any dysuria hematuria, penile discharge, testicular pain or swelling.    The history is provided by the patient.   Rash          Past Medical History:   Diagnosis Date   ??? Psychiatric disorder         Past Surgical History:   Procedure Laterality Date   ??? HX BACK SURGERY           History reviewed. No pertinent family history.     Social History     Socioeconomic History   ??? Marital status: SINGLE     Spouse name: Not on file   ??? Number of children: Not on file   ??? Years of education: Not on file   ??? Highest education level: Not on file   Occupational History   ??? Not on file   Social Needs   ??? Financial resource strain: Not on file   ??? Food insecurity     Worry: Not on file     Inability: Not on file   ??? Transportation needs     Medical: Not on file     Non-medical: Not on file   Tobacco Use   ??? Smoking status: Former Smoker     Packs/day: 0.50     Years: 15.00     Pack years: 7.50     Types: Cigarettes, Pipe, Cigars     Start date: 12/05/1998     Quit date: 12/05/2013     Years since quitting: 6.3   ??? Smokeless tobacco: Never Used   Substance and Sexual Activity   ??? Alcohol use: Yes     Comment: rarely   ??? Drug use: Not Currently     Types: Marijuana   ??? Sexual activity: Not on file   Lifestyle   ??? Physical activity     Days per week: Not on file     Minutes per session: Not on file   ??? Stress: Not on file   Relationships   ??? Social Product manager on phone: Not on file     Gets together: Not on file     Attends religious service: Not on file     Active member of club or organization: Not on file     Attends meetings of clubs or  organizations: Not on file     Relationship status: Not on file   ??? Intimate partner violence     Fear of current or ex partner: Not on file     Emotionally abused: Not on file     Physically abused: Not on file     Forced sexual activity: Not on file   Other Topics Concern   ??? Not on file   Social History Narrative   ??? Not on file                ALLERGIES: Amoxicillin    Review of Systems   Constitutional: Negative.    Respiratory: Negative.    Cardiovascular: Negative.    Gastrointestinal: Negative.    Genitourinary:  Scrotal rash   Skin: Positive for rash.   Neurological: Negative.        Vitals:    04/03/20 0953   BP: 122/83   Pulse: 71   Resp: 17   Temp: 98.2 ??F (36.8 ??C)   SpO2: 97%       Physical Exam  Vitals signs and nursing note reviewed.       GENERAL: well-developed, well-nourished, in no distress, calm and cooperative  HEAD: atraumatic, normocephalic  NEURO: Alert and oriented x3, GCS 15, motor and sensory grossly intact  LUNGS: clear to auscultation bilaterally, no wheezes, rales or rhonchi; respirations even and unlabored  HEART: regular rate and rhythm;  immediate capillary refill, palpable strong bilateral radial pulses  ABDOMEN: Soft, nontender, nondistended, no CVA tenderness  GU:  Erythematous slightly scaly rash to anterior and inferior aspect of scrotum with no induration, fluctuance, or drainage.  No testicular tenderness or swelling. No penile discharge.  No other skin rashes or lesions.        MDM  Number of Diagnoses or Management Options  Candidal dermatitis  Tinea cruris  Diagnosis management comments: Patient in no distress, well-appearing.  Presentation physical exam findings suggestive of rash in fungal etiology.  No indication of cellulitis, herpes simplex, or other complication or emergent etiology.  Will prescribe topical antifungal and dose of Diflucan.  Patient appears stable for discharge and verbalized understanding of home care, follow-up, and return precautions.                Procedures

## 2020-04-24 NOTE — Telephone Encounter (Signed)
Name and DOB verified.   Name of caller and relationship? Pt  Appointment Type (AE, NP, OV, Acute)? OV  Reason for rescheduling? Pt has to work  Appointment date being rescheduled: 5/24  Appointment time being rescheduled: 3:30  New appointment date: 6/1  New appointment time: 7:30  Does this appointment meet the no show criteria? No    CB#: [phone] (home)       Terry Hartman      You will be required to wear a mask when entering the building.   Have you had close contact with someone that has tested positive for coronavirus called COVID-19? No  Do you have a fever, cough or shortness of breath? No  Are you pending a COVID test?No

## 2020-04-24 NOTE — Telephone Encounter (Signed)
Name and DOB verified.   Name of caller and relationship? Pt  Appointment Type (AE, NP, OV, Acute)? OV  Reason for rescheduling? Pt has to work  Appointment date being rescheduled: 5/24  Appointment time being rescheduled: 3:30  New appointment date: 6/1  New appointment time: 7:30  Does this appointment meet the no show criteria? No    CB#: [phone] (home)       Terry Hartman      You will be required to wear a mask when entering the building.   Have you had close contact with someone that has tested positive for coronavirus called COVID-19? No  Do you have a fever, cough or shortness of breath? No  Are you pending a COVID test?No

## 2020-04-27 ENCOUNTER — Encounter: Attending: Physician Assistant | Primary: Physician Assistant

## 2020-04-27 NOTE — Telephone Encounter (Signed)
Noted.     Terry Hartman

## 2020-05-05 ENCOUNTER — Ambulatory Visit: Attending: Physician Assistant | Primary: Physician Assistant

## 2020-05-05 ENCOUNTER — Ambulatory Visit
Admit: 2020-05-05 | Discharge: 2020-05-05 | Payer: MEDICARE | Attending: Physician Assistant | Primary: Physician Assistant

## 2020-05-05 ENCOUNTER — Encounter: Payer: MEDICARE | Attending: Mental Health | Primary: Physician Assistant

## 2020-05-05 DIAGNOSIS — F3112 Bipolar disorder, current episode manic without psychotic features, moderate: Secondary | ICD-10-CM

## 2020-05-05 NOTE — Progress Notes (Addendum)
HISTORY OF PRESENT ILLNESS  Terry Hartman is a 39 y.o. male.  HPI  39 yo male presenting for mood disorder and lab f/u.    He hasn't been taking seroquel for the past month. He states he has been working, a lot. He wakes up around 4:15am and doesn't get home until 6-7 pm. He doesn't want to feel lethargy from seroquel in the morning. He feels when he does take it - it gives him a negative attitude. He feels better without it. Denies depression today. He does admit to anxiety. He notes racing thoughts, he notes he doesn't feel anxiety when he sleeps, but otherwise has anxiety all day long. He doesn't living in [place] area. Describes a couple incidents of witnessing others arguing and seeing people do badly. States being around yelling or screaming affects him negatively.     [facility] goes to [employer] and has a case worker. Alicia  Lussier. He has paperwork work for housing and disability. One requests if he is disabled due to alcohol or drug abuse. Pt notes he drinks "a little." He drinks a few beers a day. He doesn't drink every day. He does like to drink while eating. He denies history of substance abuse, smokes marijuana occasionally.  ROS  See HPI  Physical Exam  HENT:      Head: Normocephalic.   Eyes:      Conjunctiva/sclera: Conjunctivae normal.      Pupils: Pupils are equal, round, and reactive to light.   Pulmonary:      Effort: Pulmonary effort is normal.   Skin:     General: Skin is warm and dry.   Neurological:      Mental Status: He is alert.   Psychiatric:         Mood and Affect: Mood normal.         Behavior: Behavior normal.         Thought Content: Thought content normal.         Judgment: Judgment normal.         ASSESSMENT and PLAN    ICD-10-CM ICD-9-CM    1. Bipolar 1 disorder, manic, moderate (HCC)   Pt without medication management for psych disorder. Will discussed with case management as far as his disability and housing is concerned, previously referred to [facility] and he hasn't set  up referrals at this time, reviewed timeline to get set up with specialists and importance for proper treatment, management, and evaluation for any disability. F31.12 296.42      Please note that over 1/2 this __40__ minute visit was spent in direct face to face contact with the patient discussing current symptoms, diagnosis, and ongoing plan of care.

## 2020-05-05 NOTE — Addendum Note (Signed)
Addended by: Verdene Rio R on: 05/05/2020 08:21 AM     Modules accepted: Level of Service

## 2020-05-05 NOTE — Addendum Note (Signed)
Addendum Note by Willaim Sheng, PA at 05/05/20 0730                Author: Verdene Rio R, [place]  Service: --  Author Type: Physician Assistant       Filed: 05/05/20 0821  Encounter Date: 05/05/2020  Status: Signed          Editor: Willaim Sheng, [place] (Physician Assistant)          Addended by: Willaim Sheng on: 05/05/2020 08:21 AM    Modules accepted: Level of Service

## 2020-05-05 NOTE — Progress Notes (Signed)
HISTORY OF PRESENT ILLNESS  Terry Hartman is a 39 y.o. male.  HPI  39 yo male presenting for mood disorder and lab f/u.    He hasn't been taking seroquel for the past month. He states he has been working, a lot. He wakes up around 4:15am and doesn't get home until 6-7 pm. He doesn't want to feel lethargy from seroquel in the morning. He feels when he does take it - it gives him a negative attitude. He feels better without it. Denies depression today. He does admit to anxiety. He notes racing thoughts, he notes he doesn't feel anxiety when he sleeps, but otherwise has anxiety all day long. He doesn't living in [place] area. Describes a couple incidents of witnessing others arguing and seeing people do badly. States being around yelling or screaming affects him negatively.     He goes to [employer] and has a Retail buyer. Terry Hartman. He has paperwork work for housing and disability. One requests if he is disabled due to alcohol or drug abuse. Pt notes he drinks "a little." He drinks a few beers a day. He doesn't drink every day. He does like to drink while eating. He denies history of substance abuse, smokes marijuana occasionally.  ROS  See HPI  Physical Exam  HENT:      Head: Normocephalic.   Eyes:      Conjunctiva/sclera: Conjunctivae normal.      Pupils: Pupils are equal, round, and reactive to light.   Pulmonary:      Effort: Pulmonary effort is normal.   Skin:     General: Skin is warm and dry.   Neurological:      Mental Status: He is alert.   Psychiatric:         Mood and Affect: Mood normal.         Behavior: Behavior normal.         Thought Content: Thought content normal.         Judgment: Judgment normal.         ASSESSMENT and PLAN    ICD-10-CM ICD-9-CM    1. Bipolar 1 disorder, manic, moderate (HCC)   Pt without medication management for psych disorder. Will discussed with case management as far as his disability and housing is concerned, previously referred to [facility] and he hasn't set  up referrals at this time, reviewed timeline to get set up with specialists and importance for proper treatment, management, and evaluation for any disability. F31.12 296.42      Please note that over 1/2 this __40__ minute visit was spent in direct face to face contact with the patient discussing current symptoms, diagnosis, and ongoing plan of care.

## 2020-05-19 ENCOUNTER — Telehealth: Attending: Mental Health | Primary: Physician Assistant

## 2020-05-19 ENCOUNTER — Telehealth: Admit: 2020-05-19 | Discharge: 2020-05-19 | Payer: MEDICARE | Attending: Mental Health | Primary: Physician Assistant

## 2020-05-19 DIAGNOSIS — F411 Generalized anxiety disorder: Secondary | ICD-10-CM

## 2020-05-19 NOTE — Telephone Encounter (Signed)
Received verification of disability forms from [employer], Per pcp unable to complete forms as pt is new, unable to verify disability, and no previous medical records. Will fax forms to behavioral to evaluate    Terry Hartman

## 2020-05-19 NOTE — Progress Notes (Signed)
[facility]   Progress Note     TELEMEDICINE VISIT (Performed via Telephone or Audio & Video)     METHOD OF VISIT: VV Type: Video and Audio  PROVIDER LOCATION:  [facility] Tele[facility] Provider Location: Office  PATIENT PHYSICAL ADDRESS AT TIME OF TELEMEDICINE VISIT: Car   PARTICIPANTS: Patient   Patient gave verbal consent for this TeleHealth visit.    Omarri Eich  05/19/20     START TIME: 10:30  AM    END TIME: 11:00  AM     TYPE: Individual.    CHIEF COMPLAINT:   Chief Complaint   Patient presents with   ??? Anxiety           PARTICIPANTS:    Derald Macleod, LCSW      CONTENT:   Session was held over virtual visit with Mr. Albrecht being in his car. Mr. Eddinger noted that he did not want his face on camera as he does like being on screen. Mr. Petrey did not understand what this session was for or who had referred him. This therapist explained therapy and the first sessions assessment.  Mr. Huskins noted that he did not want to give out any more personal information and that he did not need to do any more talking; with his noting that he does not find talking helpful. Mr. Weninger talked extensively about his case workers, his trouble with disability and his trouble finding help being able to obtain what he needs. This therapist read the last note in his file which seemed to be directed toward psychiatric services with proper medication management. This was discussed. Mr. Schrader noted that he could not get time off of work to go to a psychiatric appointment and that it would "financially sink" him if he did. We talked about his diagnosis of BiPolar which he noted that he did not have. He also talked about his taking medication for ADHD/ADD which did not work well for him. Session ended with Mr. Glendinning not wanting to complete the assessment and not wanting to do therapy. This therapist suggested that he give his PCP a call to clarify what he was looking for for Mr. Down. This therapist  is using the diagnosis of anxiety due to Mr. Oyama not believing that a previous diagnosis of Bi-Polar was relevant. There is also a diagnosis of depression but not having had enough time with Mr. Dollar this therapist hesitates to use this.    PROGRESS ON GOALS / OBJECTIVES: None      PLAN:    1. Follow up in none. Client states that he does not want to do therapy

## 2020-06-01 NOTE — Telephone Encounter (Signed)
Pt calling and states received a letter from [facility] that he will lose his licence with out a medical evaluation and forms done by 8/17 appt scheduled for 7/21 8 am  M. Zenda Alpers, RN

## 2020-06-12 ENCOUNTER — Inpatient Hospital Stay
Admit: 2020-06-12 | Discharge: 2020-06-12 | Disposition: A | Discharge status: Home / Self Care | Payer: MEDICARE | Attending: Physician Assistant

## 2020-06-12 DIAGNOSIS — H60331 Swimmer's ear, right ear: Secondary | ICD-10-CM

## 2020-06-12 MED ORDER — OFLOXACIN 0.3 % EAR DROPS
0.3 % | Freq: Every day | OTIC | 0 refills | Status: AC
Start: 2020-06-12 — End: 2020-06-19

## 2020-06-12 NOTE — ED Provider Notes (Signed)
This is a 39 year old male presenting to Urgent Care for evaluation of left ear pain that has been steady since Tuesday.  States he recently went swimming in the ocean, felt he got water in the ear, has been sore since, denies any drainage, no itching.  He feels this is swimmer's ear.  States he did have a day of having some congestion on the left side of his face and sinus pressure that has resolved/not recurred.  States he has also had intermittent sore throat in the morning that resolves through the day, denies any difficulty swallowing.  No fevers or chills.  No known [facility] contacts.  Not vaccinated for [facility] id, declines [facility] id testing.           Past Medical History:   Diagnosis Date   ??? Psychiatric disorder         Past Surgical History:   Procedure Laterality Date   ??? HX BACK SURGERY           No family history on file.     Social History     Socioeconomic History   ??? Marital status: SINGLE     Spouse name: Not on file   ??? Number of children: Not on file   ??? Years of education: Not on file   ??? Highest education level: Not on file   Occupational History   ??? Not on file   Tobacco Use   ??? Smoking status: Former Smoker     Packs/day: 0.50     Years: 15.00     Pack years: 7.50     Types: Cigarettes, Pipe, Cigars     Start date: 12/05/1998     Quit date: 12/05/2013     Years since quitting: 6.5   ??? Smokeless tobacco: Never Used   Vaping Use   ??? Vaping Use: Never used   Substance and Sexual Activity   ??? Alcohol use: Yes     Comment: rarely   ??? Drug use: Not Currently     Types: Marijuana   ??? Sexual activity: Not on file   Other Topics Concern   ??? Not on file   Social History Narrative   ??? Not on file     Social Determinants of Health     Financial Resource Strain:    ??? Difficulty of Paying Living Expenses:    Food Insecurity:    ??? Worried About Programme researcher, broadcasting/film/video in the Last Year:    ??? Barista in the Last Year:    Transportation Needs:    ??? Freight forwarder (Medical):    ??? Lack of Transportation  (Non-Medical):    Physical Activity:    ??? Days of Exercise per Week:    ??? Minutes of Exercise per Session:    Stress:    ??? Feeling of Stress :    Social Connections:    ??? Frequency of Communication with Friends and Family:    ??? Frequency of Social Gatherings with Friends and Family:    ??? Attends Religious Services:    ??? Database administrator or Organizations:    ??? Attends Engineer, structural:    ??? Marital Status:    Intimate Programme researcher, broadcasting/film/video Violence:    ??? Fear of Current or Ex-Partner:    ??? Emotionally Abused:    ??? Physically Abused:    ??? Sexually Abused:  ALLERGIES: Amoxicillin    Review of Systems   Constitutional: Negative for fatigue and fever.   HENT: Positive for congestion, ear pain and sore throat. Negative for ear discharge, rhinorrhea and trouble swallowing.         See HPI   Respiratory: Negative for cough and shortness of breath.    Cardiovascular: Negative for chest pain.   Gastrointestinal: Negative for abdominal pain, diarrhea, nausea and vomiting.   Musculoskeletal: Negative for arthralgias, myalgias, neck pain and neck stiffness.   Allergic/Immunologic: Negative for environmental allergies and immunocompromised state.   Neurological: Negative for weakness and headaches.       Vitals:    06/12/20 0914   BP: 124/78   Pulse: 71   Resp: 16   Temp: 98.4 ??F (36.9 ??C)   SpO2: 96%       Physical Exam    GENERAL: well-developed, well-nourished without distress  HEAD: atraumatic, normocephalic  EYES:  lids normal appearing, no eye discharge or injection  EARS: Pinnae normal appearing with mild erythema without edema or drainage along the inside of the proximal aspect of the left ear canal with the tympanic membrane translucent, intact, nonbulging with normal light reflex; no mastoid erythema or tenderness    right ear canal normal appearing without swelling or erythema, right,  tympanic membrane pearly gray, non-bulging, normal light reflex, no mastoid erythema or tenderness  NOSE: nasal  turbinates non-swollen, moist without redness or discharge; no maxillary sinus tenderness  OROPHARYNX: buccal mucosa moist, oropharynx clear; uvula midline, no tonsillar injection, swelling, or exudates  NECK: supple, no anterior cervical adenopathy, no posterior cervical adenopathy, full ROM, non-tender  HEART: regular rate and rhythm, no murmurs, regular right radial pulse   LUNGS:  No respiratory distress, good inspiratory effort, clear to auscultation throughout with no wheezing, or crackles  NEURO:  Alert, oriented, normal speech    MDM  Number of Diagnoses or Management Options  Acute swimmer's ear of right side  Diagnosis management comments: Patient afebrile, non ill appearing with focal left ear pain and a resolved episode of nasal congestion after recent water exposure with suspicion of swimmers ear.  Exam consistent with developing otitis externa of the left ear canal without swelling of the ear canal to indicate the need for topical steroids.  Will treat with topical ofloxacin ear drops in the left ear.  There are no signs of a middle ear infection, no symptoms to indicate mastoiditis, no evidence of sinus infection, strep throat infection, or pneumonia.    COVID 19 considered with current COVID 19 pandemic, COVID-19 testing today Strongly recommended in addition to considering getting the COVID vaccine at a later date however patient adamantly declined.    Based on current local CDC recommendations patient is advised to quarantine from the public according to current CDC guidelines for the next 6 days with concern for possible COVID infection.    Patient declined a work note      Advised on typical signs and symptoms warranting follow-up or emergency care, patient declined formal print out of verbal after visit summary instructions for recommended symptomatic care, follow up, and plan of care.  All patient's questions, concerns addressed and answered.  Patient agrees with the plan of care.                Procedures

## 2020-06-12 NOTE — ED Notes (Signed)
Patient is here today with left ear pain that he has had since Tuesday. He has also noticed congestion and a sore throat in the morning that goes away when he starts moving.

## 2020-06-24 ENCOUNTER — Ambulatory Visit: Attending: Physician Assistant | Primary: Physician Assistant

## 2020-06-24 ENCOUNTER — Ambulatory Visit
Admit: 2020-06-24 | Discharge: 2020-06-24 | Payer: MEDICARE | Attending: Physician Assistant | Primary: Physician Assistant

## 2020-06-24 DIAGNOSIS — F3112 Bipolar disorder, current episode manic without psychotic features, moderate: Secondary | ICD-10-CM

## 2020-06-24 NOTE — Telephone Encounter (Signed)
Pt name and DOB verified.    The pt is needing to speak to the receptionist on the second floor.  He was at the office today to see [place].  There is paperwork to fax for a driver medical review, and was asked to call to make sure DMV received the fax.    He states Rennis Chris has not received it.      He is asking for that to be faxed.  Please contact the patient to let him know when faxed.      Please contact the patient to advise.    [phone] (home)     Alease Frame

## 2020-06-24 NOTE — Progress Notes (Signed)
HISTORY OF PRESENT ILLNESS  Terry Hartman is a 39 y.o. male.  HPI  39 yo male presenting for mood disorder and DMV paperwork  ??  He continues to use seroquel as needed, for sleep. He denies likely to use it for that reason, and prefers to sleep "naturally." Denies depression, anxiety, or mood disorder. History of bipolar without recent manic episodes. Is concerned about DMV paperwork, notes he has never had his license suspended in other states due to his MH. 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 He wakes up around 4:15am - has to be at work around [employer] and doesn't get home until 6-7 pm.  He notes occasional racing thoughts, he doesn't feel anxiety at night when he sleeps, but feels he is doing good, day to day.   ??  He goes to [employer] and has a Retail buyer. Recently changed, was presenting to a phone discussion weekly. Terry Hartman.  ROS  See HPI  Physical Exam  HENT:      Head: Normocephalic.   Eyes:      Conjunctiva/sclera: Conjunctivae normal.      Pupils: Pupils are equal, round, and reactive to light.   Pulmonary:      Effort: Pulmonary effort is normal.   Skin:     General: Skin is warm and dry.   Neurological:      Mental Status: He is alert.   Psychiatric:         Mood and Affect: Mood normal.         Behavior: Behavior normal.         ASSESSMENT and PLAN    ICD-10-CM ICD-9-CM    1. Bipolar 1 disorder, manic, moderate (HCC)   DMV paperwork completed, reviewed with patient proper rest, relaxation, and care of his MH with conservative therapy, consider CBT, continue to f/u with case manager.    F31.12 296.42

## 2020-07-03 NOTE — Telephone Encounter (Signed)
Re faxed over the Driver Medical Evaluation form this morning. Called and spoke with patient and they received it already.     Alferd Apa

## 2020-07-10 ENCOUNTER — Emergency Department: Admit: 2020-07-11 | Payer: MEDICARE | Primary: Physician Assistant

## 2020-07-10 DIAGNOSIS — S31119A Laceration without foreign body of abdominal wall, unspecified quadrant without penetration into peritoneal cavity, initial encounter: Secondary | ICD-10-CM
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
## 2020-07-10 NOTE — ED Notes (Signed)
Report given to nursing supervisor at [facility] and [facility] has been called to arrange transport.
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
## 2020-07-10 NOTE — ED Notes (Signed)
Wound to LUQ cleansed with NS and DSD applied. No bleeding noted at this time. Pt tolerated well. Pt states he had a TDap in August or September of 2019. Provider is aware and states the booster can be held.

## 2020-07-11 ENCOUNTER — Inpatient Hospital Stay
Admit: 2020-07-11 | Discharge: 2020-07-11 | Disposition: A | Discharge status: Other Acute Inpatient Hospital | Payer: MEDICARE | Attending: Emergency Medicine

## 2020-07-11 LAB — CBC WITH AUTOMATED DIFF
ABS. BASOPHILS: 0 10*3/uL (ref 0.0–0.1)
ABS. EOSINOPHILS: 0.1 10*3/uL (ref 0.0–0.5)
ABS. IMM. GRANS.: 0.3 10*3/uL — ABNORMAL HIGH (ref 0.00–0.04)
ABS. LYMPHOCYTES: 1.7 10*3/uL (ref 1.3–3.6)
ABS. MONOCYTES: 0.9 10*3/uL — ABNORMAL HIGH (ref 0.3–0.8)
ABS. NEUTROPHILS: 7 10*3/uL — ABNORMAL HIGH (ref 1.6–6.1)
ABSOLUTE NRBC: 0 10*3/uL
BASOPHILS: 0 % (ref 0–2)
EOSINOPHILS: 1 % (ref 0–5)
HCT: 43 % (ref 42.0–52.0)
HGB: 15 g/dL (ref 14.0–18.0)
IMMATURE GRANULOCYTES: 3 % — ABNORMAL HIGH (ref 0.0–0.6)
LYMPHOCYTES: 17 % (ref 14–46)
MCH: 30.8 PG (ref 27.0–31.0)
MCHC: 34.9 g/dL (ref 33.0–37.0)
MCV: 88.3 FL (ref 80.0–94.0)
MONOCYTES: 9 % (ref 5–12)
MPV: 9.7 FL (ref 7.4–10.4)
NEUTROPHILS: 70 % (ref 47–80)
NRBC: 0 PER 100 WBC
PLATELET: 198 10*3/uL (ref 130–400)
RBC: 4.87 M/uL (ref 4.70–6.10)
RDW: 12.2 % (ref 11.5–14.5)
WBC: 10 10*3/uL (ref 4.5–10.9)

## 2020-07-11 LAB — METABOLIC PANEL, COMPREHENSIVE
ALT (SGPT): 62 U/L (ref 12–78)
AST (SGOT): 27 U/L (ref 10–37)
Albumin: 4 g/dL (ref 3.4–5.0)
Alk. phosphatase: 98 U/L (ref 43–117)
BUN: 22 MG/DL (ref 7–22)
Bilirubin, total: 0.3 mg/dL (ref 0.00–1.00)
CO2: 26 mmol/L (ref 21–32)
Calcium: 8.7 MG/DL (ref 8.5–10.1)
Chloride: 107 mmol/L (ref 98–108)
Creatinine: 0.9 MG/DL (ref 0.70–1.30)
GFR est AA: >60 mL/min/{1.73_m2} (ref >60)
GFR est non-AA: >60 mL/min/{1.73_m2} (ref >60)
Glucose: 107 mg/dL — ABNORMAL HIGH (ref 74–106)
Potassium: 3.9 mmol/L (ref 3.4–5.1)
Protein, total: 7.6 g/dL (ref 6.4–8.2)
Sodium: 139 mmol/L (ref 136–145)

## 2020-07-11 LAB — ETHYL ALCOHOL
ETHYL ALCOHOL: 29 MG/DL — ABNORMAL HIGH (ref <3.0)
Ethyl Alcohol: 29 MG/DL — ABNORMAL HIGH (ref <3.0)

## 2020-07-11 LAB — LIPASE
Lipase: 60 U/L (ref 7–393)
Lipase: 60 U/L (ref 7–393)

## 2020-07-11 LAB — COMPREHENSIVE METABOLIC PANEL
ALT: 62 U/L (ref 12–78)
AST: 27 U/L (ref 10–37)
Albumin: 4 g/dL (ref 3.4–5.0)
Alkaline Phosphatase: 98 U/L (ref 43–117)
BUN: 22 MG/DL (ref 7–22)
CO2: 26 mmol/L (ref 21–32)
Calcium: 8.7 MG/DL (ref 8.5–10.1)
Chloride: 107 mmol/L (ref 98–108)
Creatinine: 0.9 MG/DL (ref 0.70–1.30)
EGFR IF NonAfrican American: >60 mL/min/{1.73_m2} (ref >60)
GFR African American: >60 mL/min/{1.73_m2} (ref >60)
Glucose: 107 mg/dL — ABNORMAL HIGH (ref 74–106)
Potassium: 3.9 mmol/L (ref 3.4–5.1)
Sodium: 139 mmol/L (ref 136–145)
Total Bilirubin: 0.3 mg/dL (ref 0.00–1.00)
Total Protein: 7.6 g/dL (ref 6.4–8.2)

## 2020-07-11 LAB — CBC WITH AUTO DIFFERENTIAL
Basophils %: 0 % (ref 0–2)
Basophils Absolute: 0 10*3/uL (ref 0.0–0.1)
Eosinophils %: 1 % (ref 0–5)
Eosinophils Absolute: 0.1 10*3/uL (ref 0.0–0.5)
Granulocyte Absolute Count: 0.3 10*3/uL — ABNORMAL HIGH (ref 0.00–0.04)
Hematocrit: 43 % (ref 42.0–52.0)
Hemoglobin: 15 g/dL (ref 14.0–18.0)
Immature Granulocytes: 3 % — ABNORMAL HIGH (ref 0.0–0.6)
Lymphocytes %: 17 % (ref 14–46)
Lymphocytes Absolute: 1.7 10*3/uL (ref 1.3–3.6)
MCH: 30.8 PG (ref 27.0–31.0)
MCHC: 34.9 g/dL (ref 33.0–37.0)
MCV: 88.3 FL (ref 80.0–94.0)
MPV: 9.7 FL (ref 7.4–10.4)
Monocytes %: 9 % (ref 5–12)
Monocytes Absolute: 0.9 10*3/uL — ABNORMAL HIGH (ref 0.3–0.8)
NRBC Absolute: 0 10*3/uL
Neutrophils %: 70 % (ref 47–80)
Neutrophils Absolute: 7 10*3/uL — ABNORMAL HIGH (ref 1.6–6.1)
Nucleated RBCs: 0 PER 100 WBC
Platelets: 198 10*3/uL (ref 130–400)
RBC: 4.87 M/uL (ref 4.70–6.10)
RDW: 12.2 % (ref 11.5–14.5)
WBC: 10 10*3/uL (ref 4.5–10.9)

## 2020-07-11 MED ORDER — DIPHTH,PERTUS(AC)TETANUS VAC(PF) 2.5 LF UNIT-8 MCG-5 LF/0.5 ML INJ
INTRAMUSCULAR | Status: DC
Start: 2020-07-11 — End: 2020-07-10

## 2020-07-11 MED ORDER — CEFAZOLIN 1 GRAM SOLUTION FOR INJECTION
1 gram | Freq: Once | INTRAMUSCULAR | Status: AC
Start: 2020-07-11 — End: 2020-07-10
  Administered 2020-07-11: 03:00:00 via INTRAVENOUS

## 2020-07-11 MED ORDER — SODIUM CHLORIDE 0.9 % IJ SYRG
Freq: Once | INTRAMUSCULAR | Status: AC
Start: 2020-07-11 — End: 2020-07-10
  Administered 2020-07-11: 03:00:00 via INTRAVENOUS

## 2020-07-11 MED FILL — BD POSIFLUSH NORMAL SALINE 0.9 % INJECTION SYRINGE: INTRAMUSCULAR | Qty: 10

## 2020-07-11 MED FILL — SODIUM CHLORIDE 0.9 % IV PIGGY BACK: INTRAVENOUS | Qty: 50

## 2020-07-11 MED FILL — CEFAZOLIN 1 GRAM SOLUTION FOR INJECTION: 1 gram | INTRAMUSCULAR | Qty: 1000

## 2020-07-11 NOTE — ED Notes (Signed)
 TRANSFER - OUT REPORT:    Verbal report given to Alicia Ranta on Tico Crotteau  being transferred to [facility] ED for urgent transfer      Report consisted of patient's Situation, Background, Assessment and   Recommendations(SBAR).     Information from the following report(s) SBAR was reviewed with the receiving nurse.    Lines:   Peripheral IV 07/10/20 Anterior;Right Forearm (Active)        Opportunity for questions and clarification was provided.      Patient transported with:   Monitor via [place].

## 2020-08-11 ENCOUNTER — Encounter: Attending: Physician Assistant | Primary: Physician Assistant

## 2020-09-22 ENCOUNTER — Inpatient Hospital Stay
Admit: 2020-09-22 | Discharge: 2020-09-23 | Disposition: A | Discharge status: Home / Self Care | Payer: MEDICARE | Attending: Physician Assistant

## 2020-09-22 DIAGNOSIS — J310 Chronic rhinitis: Secondary | ICD-10-CM

## 2020-09-22 NOTE — ED Provider Notes (Signed)
This is a 39 year old male presents to the Urgent Care today with a complaint of rhinorrhea, sinus pressure and pain, mild cough, patient is declining covid 19 testing, he denies any loss of taste or smell.  He denies any difficulty breathing, chest pain or shortness of breath.  He denies any fever chills.  States had some relief with over-the-counter medications but then symptoms got worse.  He feels that this is a sinus infection he has had multiple before.  He says symptoms are similar.  Patient notes that he has been having sinus pressure and pain for over 3 weeks.  Although worsening over the past few days.  He denies any known exposure covered 19. He states he is otherwise healthy with no other issues or concerns.             Past Medical History:   Diagnosis Date   ??? Psychiatric disorder         Past Surgical History:   Procedure Laterality Date   ??? HX BACK SURGERY     ??? PR ABDOMEN SURGERY PROC UNLISTED           History reviewed. No pertinent family history.     Social History     Socioeconomic History   ??? Marital status: SINGLE     Spouse name: Not on file   ??? Number of children: Not on file   ??? Years of education: Not on file   ??? Highest education level: Not on file   Occupational History   ??? Not on file   Tobacco Use   ??? Smoking status: Former Smoker     Packs/day: 0.50     Years: 15.00     Pack years: 7.50     Types: Cigarettes, Pipe, Cigars     Start date: 12/05/1998     Quit date: 12/05/2013     Years since quitting: 6.8   ??? Smokeless tobacco: Never Used   Vaping Use   ??? Vaping Use: Never used   Substance and Sexual Activity   ??? Alcohol use: Yes     Comment: rarely   ??? Drug use: Not Currently     Types: Marijuana   ??? Sexual activity: Not on file   Other Topics Concern   ??? Not on file   Social History Narrative   ??? Not on file     Social Determinants of Health     Financial Resource Strain:    ??? Difficulty of Paying Living Expenses:    Food Insecurity:    ??? Worried About Programme researcher, broadcasting/film/video in the Last  Year:    ??? Barista in the Last Year:    Transportation Needs:    ??? Freight forwarder (Medical):    ??? Lack of Transportation (Non-Medical):    Physical Activity:    ??? Days of Exercise per Week:    ??? Minutes of Exercise per Session:    Stress:    ??? Feeling of Stress :    Social Connections:    ??? Frequency of Communication with Friends and Family:    ??? Frequency of Social Gatherings with Friends and Family:    ??? Attends Religious Services:    ??? Database administrator or Organizations:    ??? Attends Engineer, structural:    ??? Marital Status:    Intimate Programme researcher, broadcasting/film/video Violence:    ??? Fear of Current or Ex-Partner:    ??? Emotionally Abused:    ???  Physically Abused:    ??? Sexually Abused:                 ALLERGIES: Amoxicillin    Review of Systems   Constitutional: Negative for chills, diaphoresis, fatigue and fever.   HENT: Positive for congestion, rhinorrhea, sinus pressure and sinus pain. Negative for sore throat.    Respiratory: Negative for cough and shortness of breath.    Gastrointestinal: Negative for abdominal pain, diarrhea, nausea and vomiting.   Skin: Negative for rash.   Allergic/Immunologic: Negative for immunocompromised state.   Neurological: Positive for headaches.       Vitals:    09/22/20 1940 09/22/20 1946   BP:  (!) 138/92   Pulse:  80   Resp:  16   Temp:  97.7 ??F (36.5 ??C)   SpO2:  98%   Weight: 86.2 kg (190 lb)    Height: 5\' 9"  (1.753 m)        Physical Exam  Vitals and nursing note reviewed.   Constitutional:       General: He is not in acute distress.     Appearance: Normal appearance. He is normal weight. He is not ill-appearing, toxic-appearing or diaphoretic.   HENT:      Head: Normocephalic and atraumatic.      Right Ear: Tympanic membrane, ear canal and external ear normal.      Left Ear: Tympanic membrane, ear canal and external ear normal.      Nose: Mucosal edema, congestion and rhinorrhea present.      Right Sinus: Maxillary sinus tenderness present.      Left Sinus: Maxillary  sinus tenderness present.      Mouth/Throat:      Mouth: Mucous membranes are moist.      Pharynx: Oropharynx is clear. No oropharyngeal exudate or posterior oropharyngeal erythema.   Eyes:      Extraocular Movements: Extraocular movements intact.      Conjunctiva/sclera: Conjunctivae normal.      Pupils: Pupils are equal, round, and reactive to light.   Cardiovascular:      Rate and Rhythm: Normal rate and regular rhythm.      Pulses: Normal pulses.      Heart sounds: Normal heart sounds.   Pulmonary:      Effort: Pulmonary effort is normal.      Breath sounds: Normal breath sounds.   Abdominal:      General: Abdomen is flat. Bowel sounds are normal.      Palpations: Abdomen is soft.   Musculoskeletal:         General: No tenderness. Normal range of motion.   Lymphadenopathy:      Cervical: No cervical adenopathy.   Skin:     General: Skin is warm and dry.      Capillary Refill: Capillary refill takes less than 2 seconds.      Findings: No rash.   Neurological:      General: No focal deficit present.      Mental Status: He is alert and oriented to person, place, and time.   Psychiatric:         Mood and Affect: Mood normal.         Behavior: Behavior normal.         Thought Content: Thought content normal.         Judgment: Judgment normal.         MDM  Number of Diagnoses or Management Options  Rhinosinusitis  Diagnosis management  comments: Patient with upper respiratory symptoms patient is declining testing at this time, he does not feel there is any chance that this could be covid 19, he does not believe COVID 19 is a worrisome infection,  Patient was symptoms for over 3 weeks  At this time recommending treatment as if this is allergic but also for bacterial possible sinusitis  Prescribed doxycycline as patient has amoxicillin allergy take this antibiotic as directed  Prescribed Zyrtec antihistamine and Flonase nasal spray  Recommend also use Mucinex  Prescribed these medications patient to use these medications  as directed  Patient has declined COVID 19 testing at this time  I recommend that patient could reconsider this that he is not around other people he is currently having symptoms needs to wear a mask, stay home, stay away from others of that you do not spread any illness time   return to urgent care as needed  Patient should go to the emergency room with any new worsening symptoms including but not limited to chest pain, shortness of breath, fever chills as these may indicate new or worsening condition    Engaged in shared decision making with pt   Pt advised of the most likely etiology of their symptoms, possible but less likely early illness of more serious etiology. Need for expedited f/u w/ PCP, and given strict return precautions. Pt understands and is amenable with plan.    The plan was developed with patient after reviewing risks and benefits, of medication/medical therapy options.  Patient agrees with the plan of care.  All patient's questions, concerns addressed and answered.  Advised on typical signs and symptoms warranting follow-up or emergency care, see after visit summary instructions for recommended symptomatic care, further details regarding follow up, and plan of care.     DISCLAIMER: This note was created using voice recognition dictation software. Efforts were made by me to ensure accuracy, however some errors may be present due to limitations of this technology and occasionally words are not transcribed correctly             Procedures

## 2020-09-22 NOTE — ED Notes (Signed)
Congestion and cough X 3 weeks. Is refusing COVID testing.

## 2020-09-23 MED ORDER — MUCINEX 1,200 MG TABLET, EXTENDED RELEASE
1200 mg | ORAL_TABLET | Freq: Two times a day (BID) | ORAL | 0 refills | Status: AC
Start: 2020-09-23 — End: 2020-10-02

## 2020-09-23 MED ORDER — DOXYCYCLINE HYCLATE 100 MG TAB
100 mg | ORAL_TABLET | Freq: Two times a day (BID) | ORAL | 0 refills | Status: AC
Start: 2020-09-23 — End: 2020-10-02

## 2020-09-23 MED ORDER — CETIRIZINE 10 MG TAB
10 mg | ORAL_TABLET | Freq: Every day | ORAL | 0 refills | Status: AC
Start: 2020-09-23 — End: 2020-10-02

## 2020-09-23 MED ORDER — FLUTICASONE 50 MCG/ACTUATION NASAL SPRAY, SUSP
50 mcg/actuation | Freq: Every day | NASAL | 0 refills | Status: AC
Start: 2020-09-23 — End: ?

## 2021-01-02 ENCOUNTER — Encounter (HOSPITAL_COMMUNITY): Payer: Self-pay | Admitting: Emergency Medicine

## 2021-01-02 ENCOUNTER — Emergency Department (HOSPITAL_COMMUNITY)
Admission: EM | Admit: 2021-01-02 | Discharge: 2021-01-02 | Disposition: A | Discharge status: Home / Self Care | Payer: Medicare Other | Attending: Emergency Medicine | Admitting: Emergency Medicine

## 2021-01-02 ENCOUNTER — Other Ambulatory Visit: Payer: Self-pay

## 2021-01-02 DIAGNOSIS — X503XXA Overexertion from repetitive movements, initial encounter: Secondary | ICD-10-CM | POA: Diagnosis not present

## 2021-01-02 DIAGNOSIS — G5601 Carpal tunnel syndrome, right upper limb: Secondary | ICD-10-CM | POA: Insufficient documentation

## 2021-01-02 DIAGNOSIS — F17200 Nicotine dependence, unspecified, uncomplicated: Secondary | ICD-10-CM | POA: Diagnosis not present

## 2021-01-02 DIAGNOSIS — M25531 Pain in right wrist: Secondary | ICD-10-CM | POA: Diagnosis present

## 2021-01-02 MED ORDER — PREDNISONE 20 MG PO TABS: 40.0000 mg | ORAL_TABLET | Freq: Every day | ORAL | 0 refills | Status: DC

## 2021-01-02 NOTE — ED Notes (Signed)
Pt yelling profanities demanding to leave ER demanding to leave.  Pt cussing secretary calling her multiple names.  Staff attempting to assist him pt uncooperative, security called to bedside.  Patient given discharge paper and left ER.

## 2021-01-02 NOTE — ED Notes (Signed)
Pt refused wrist brace  

## 2021-01-02 NOTE — ED Provider Notes (Signed)
[facility] EMERGENCY DEPARTMENT Provider Note   CSN: [id number] Arrival date & time: 01/02/21  0831     History Chief Complaint  Patient presents with  . Shoulder Pain  . Arm Pain    Victor [name] is a 40 y.o. male.  HPI Patient presents with right wrist pain over the last couple weeks.  Thinks it is from his repetitive work.  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  States has had problems like this before that resolved with some steroids.  States it is mostly in the palm of his hand although does involve the first finger probably the worst.  At times his pain is in his shoulder to go down to his elbow to.  No neck pain.  No numbness weakness.  Still has use the hands.  Sometimes it feels better when he lays down.  Feels worse after more use of the hand.  States the pain is dull.  Had episodes like this years ago that improved with some treatment by Dr. And quitting the job he was doing.    Past Medical History:  Diagnosis Date  . Medical history non-contributory     There are no problems to display for this patient.   Past Surgical History:  Procedure Laterality Date  . NO PAST SURGERIES         History reviewed. No pertinent family history.  Social History   Tobacco Use  . Smoking status: Current Every Day Smoker  . Smokeless tobacco: Never Used  Substance Use Topics  . Alcohol use: Yes    Comment: occasional  . Drug use: Yes    Types: Marijuana    Comment: rarely uses marijuana last used 5 days ago.     Home Medications Prior to Admission medications   Medication Sig Start Date End Date Taking? Authorizing Provider  predniSONE (DELTASONE) 20 MG tablet Take 2 tablets (40 mg total) by mouth daily. 01/02/21  Yes Benjiman Core, MD  QUEtiapine (SEROQUEL) 50 MG tablet Take 50 mg by mouth at bedtime as needed. Take one to two tablets    [provider]    Allergies    Patient has no known allergies.  Review of Systems   Review of Systems   Constitutional: Negative for appetite change.  HENT: Negative for congestion.   Respiratory: Negative for shortness of breath.   Gastrointestinal: Negative for abdominal pain.  Musculoskeletal: Negative for back pain.       Right shoulder and right wrist pain.  Skin: Negative for rash.  Neurological: Negative for weakness and numbness.  Psychiatric/Behavioral: Negative for confusion.    Physical Exam Updated Vital Signs BP (!) 142/94   Pulse 66   Temp 98.4 F (36.9 C) (Oral)   Resp 17   Ht 5\' 9"  (1.753 m)   Wt 81.6 kg   SpO2 98%   BMI 26.58 kg/m   Physical Exam Vitals and nursing note reviewed.  HENT:     Head: Atraumatic.  Eyes:     General: No scleral icterus. Cardiovascular:     Rate and Rhythm: Normal rate.  Musculoskeletal:     Comments: No midline cervical tenderness.  No tenderness over trapezius.  Good range of motion in the shoulder without tenderness.  No elbow tenderness.  No pain with prolonged flexion at the wrist.  Good strength in hand.  Strong radial pulse.  Sensation grossly intact over radial median and ulnar distribution.  No skin changes.  Skin:  Capillary Refill: Capillary refill takes less than 2 seconds.  Neurological:     Mental Status: He is alert and oriented to person, place, and time.     ED Results / Procedures / Treatments   Labs (all labs ordered are listed, but only abnormal results are displayed) Labs Reviewed - No data to display  EKG None  Radiology No results found.  Procedures Procedures   Medications Ordered in ED Medications - No data to display  ED Course  I have reviewed the triage vital signs and the nursing notes.  Pertinent labs & imaging results that were available during my care of the patient were reviewed by me and considered in my medical decision making (see chart for details).    MDM Rules/Calculators/A&P                          Patient with shoulder wrist and hand pain.  I think likely  secondary to overuse versus possible carpal tunnel syndrome.  Will treat with steroids.  Had ordered wrist brace but patient refused.  Will have patient follow-up with hand surgery who is covered by Dr. Romeo Apple at this time.  Do not think x-ray will really add much to the work-up at this time.  No acute injury.  Discharge home Final Clinical Impression(s) / ED Diagnoses Final diagnoses:  Carpal tunnel syndrome of right wrist  Overuse injury    Rx / DC Orders ED Discharge Orders         Ordered    predniSONE (DELTASONE) 20 MG tablet  Daily        01/02/21 0857           Benjiman Core, MD 01/02/21 [phone]

## 2021-01-02 NOTE — ED Notes (Signed)
Pt refused to have brace placed.

## 2021-01-02 NOTE — ED Triage Notes (Signed)
Pt states he has been having right shoulder pain that radiates to down his arm numbness to his forearm and his fingers.  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 "

## 2022-10-01 ENCOUNTER — Other Ambulatory Visit: Payer: Self-pay

## 2022-10-01 ENCOUNTER — Emergency Department (HOSPITAL_COMMUNITY)
Admission: EM | Admit: 2022-10-01 | Discharge: 2022-10-02 | Disposition: A | Discharge status: Home / Self Care | Payer: Medicare HMO | Attending: Emergency Medicine | Admitting: Emergency Medicine

## 2022-10-01 ENCOUNTER — Encounter (HOSPITAL_COMMUNITY): Payer: Self-pay

## 2022-10-01 DIAGNOSIS — S4992XA Unspecified injury of left shoulder and upper arm, initial encounter: Secondary | ICD-10-CM | POA: Diagnosis present

## 2022-10-01 DIAGNOSIS — Y92009 Unspecified place in unspecified non-institutional (private) residence as the place of occurrence of the external cause: Secondary | ICD-10-CM | POA: Insufficient documentation

## 2022-10-01 DIAGNOSIS — Z79899 Other long term (current) drug therapy: Secondary | ICD-10-CM | POA: Diagnosis not present

## 2022-10-01 DIAGNOSIS — F29 Unspecified psychosis not due to a substance or known physiological condition: Secondary | ICD-10-CM | POA: Insufficient documentation

## 2022-10-01 DIAGNOSIS — R4689 Other symptoms and signs involving appearance and behavior: Secondary | ICD-10-CM

## 2022-10-01 DIAGNOSIS — F3110 Bipolar disorder, current episode manic without psychotic features, unspecified: Secondary | ICD-10-CM

## 2022-10-01 DIAGNOSIS — S40812A Abrasion of left upper arm, initial encounter: Secondary | ICD-10-CM | POA: Insufficient documentation

## 2022-10-01 LAB — COMPREHENSIVE METABOLIC PANEL
ALT: 34 U/L (ref 0–44)
AST: 31 U/L (ref 15–41)
Albumin: 4.1 g/dL (ref 3.5–5.0)
Alkaline Phosphatase: 83 U/L (ref 38–126)
Anion gap: 9 (ref 5–15)
BUN: 22 mg/dL — ABNORMAL HIGH (ref 6–20)
CO2: 21 mmol/L — ABNORMAL LOW (ref 22–32)
Calcium: 8.4 mg/dL — ABNORMAL LOW (ref 8.9–10.3)
Chloride: 106 mmol/L (ref 98–111)
Creatinine, Ser: 0.7 mg/dL (ref 0.61–1.24)
GFR, Estimated: >60 mL/min (ref >60)
Glucose, Bld: 105 mg/dL — ABNORMAL HIGH (ref 70–99)
Potassium: 3.9 mmol/L (ref 3.5–5.1)
Sodium: 136 mmol/L (ref 135–145)
Total Bilirubin: 0.8 mg/dL (ref 0.3–1.2)
Total Protein: 7.2 g/dL (ref 6.5–8.1)

## 2022-10-01 LAB — ACETAMINOPHEN LEVEL: Acetaminophen (Tylenol), Serum: <10 ug/mL — ABNORMAL LOW (ref 10–30)

## 2022-10-01 LAB — ETHANOL: Alcohol, Ethyl (B): <10 mg/dL (ref <10)

## 2022-10-01 LAB — SALICYLATE LEVEL: Salicylate Lvl: <7 mg/dL — ABNORMAL LOW (ref 7.0–30.0)

## 2022-10-01 LAB — CBC
HCT: 42.2 % (ref 39.0–52.0)
Hemoglobin: 14.7 g/dL (ref 13.0–17.0)
MCH: 30.6 pg (ref 26.0–34.0)
MCHC: 34.8 g/dL (ref 30.0–36.0)
MCV: 87.7 fL (ref 80.0–100.0)
Platelets: 201 10*3/uL (ref 150–400)
RBC: 4.81 MIL/uL (ref 4.22–5.81)
RDW: 12.4 % (ref 11.5–15.5)
WBC: 10.9 10*3/uL — ABNORMAL HIGH (ref 4.0–10.5)
nRBC: 0 % (ref 0.0–0.2)
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
## 2022-10-01 NOTE — ED Notes (Signed)
Pt reports he does not and will not take any medication while he is here in the ER. He reports he does not need any medicaiton.

## 2022-10-02 DIAGNOSIS — F3110 Bipolar disorder, current episode manic without psychotic features, unspecified: Secondary | ICD-10-CM

## 2022-10-02 DIAGNOSIS — S40812A Abrasion of left upper arm, initial encounter: Secondary | ICD-10-CM | POA: Diagnosis not present

## 2022-10-02 LAB — RAPID URINE DRUG SCREEN, HOSP PERFORMED
Amphetamines: NOT DETECTED
Barbiturates: NOT DETECTED
Benzodiazepines: NOT DETECTED
Cocaine: NOT DETECTED
Opiates: NOT DETECTED
Tetrahydrocannabinol: NOT DETECTED

## 2022-10-02 MED ORDER — ACETAMINOPHEN 325 MG PO TABS: 650.0000 mg | ORAL_TABLET | ORAL | Status: DC | PRN

## 2022-10-02 MED ORDER — ONDANSETRON HCL 4 MG PO TABS: 4.0000 mg | ORAL_TABLET | Freq: Three times a day (TID) | ORAL | Status: DC | PRN

## 2022-10-02 MED ORDER — NICOTINE 21 MG/24HR TD PT24: 21.0000 mg | MEDICATED_PATCH | Freq: Every day | TRANSDERMAL | Status: DC

## 2022-10-02 MED ORDER — ALUM & MAG HYDROXIDE-SIMETH 200-200-20 MG/5ML PO SUSP: 30.0000 mL | Freq: Four times a day (QID) | ORAL | Status: DC | PRN

## 2022-10-02 MED ORDER — QUETIAPINE FUMARATE 100 MG PO TABS: 100.0000 mg | ORAL_TABLET | Freq: Every evening | ORAL | Status: DC | PRN

## 2022-10-02 NOTE — ED Notes (Signed)
Pt moved to Dictation room for TTS consult at this time. Pt remains calm and cooperative at this time.
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
## 2022-10-02 NOTE — ED Notes (Signed)
IVC paperwork faxed to [facility] @ [phone] and [phone]
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
## 2022-10-02 NOTE — ED Notes (Signed)
Refused covid resp panel.
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
## 2022-10-02 NOTE — ED Provider Notes (Signed)
Received update from psychiatric team that patient has been psychiatrically cleared.  Family has been communicated with regarding patient's ongoing care and management and agrees.  Patient is hemodynamically stable in no acute distress.  Patient has been resting comfortably throughout the day today with no episodes of agitation or any other decompensation.  Patient stable for outpatient care management at this time per psychiatric recommendations.   Tretha Sciara, MD 10/02/22 1456

## 2022-10-02 NOTE — ED Notes (Signed)
ED Provider at bedside. 

## 2022-10-02 NOTE — ED Notes (Signed)
Gave pt breakfast tray

## 2022-10-02 NOTE — ED Notes (Addendum)
@   9826   Charted departure condition V/S and pain assessment in error in wrong chart.

## 2022-10-02 NOTE — ED Notes (Signed)
Pt on the phone at this time 

## 2022-10-02 NOTE — ED Notes (Signed)
Unable to obtain repeat d/c vitals due to pt leaving facility before being able to complete the vitals.

## 2022-11-25 ENCOUNTER — Other Ambulatory Visit: Payer: Self-pay

## 2022-11-25 ENCOUNTER — Emergency Department (HOSPITAL_COMMUNITY): Payer: Medicare HMO

## 2022-11-25 ENCOUNTER — Emergency Department (HOSPITAL_COMMUNITY)
Admission: EM | Admit: 2022-11-25 | Discharge: 2022-11-25 | Disposition: A | Discharge status: Home / Self Care | Payer: Medicare HMO | Attending: Emergency Medicine | Admitting: Emergency Medicine

## 2022-11-25 ENCOUNTER — Encounter (HOSPITAL_COMMUNITY): Payer: Self-pay

## 2022-11-25 DIAGNOSIS — F319 Bipolar disorder, unspecified: Secondary | ICD-10-CM | POA: Insufficient documentation

## 2022-11-25 DIAGNOSIS — R0789 Other chest pain: Secondary | ICD-10-CM | POA: Diagnosis not present

## 2022-11-25 DIAGNOSIS — Z1152 Encounter for screening for COVID-19: Secondary | ICD-10-CM | POA: Diagnosis not present

## 2022-11-25 DIAGNOSIS — F17200 Nicotine dependence, unspecified, uncomplicated: Secondary | ICD-10-CM | POA: Insufficient documentation

## 2022-11-25 DIAGNOSIS — Z79899 Other long term (current) drug therapy: Secondary | ICD-10-CM | POA: Insufficient documentation

## 2022-11-25 DIAGNOSIS — R197 Diarrhea, unspecified: Secondary | ICD-10-CM | POA: Diagnosis not present

## 2022-11-25 DIAGNOSIS — R0602 Shortness of breath: Secondary | ICD-10-CM | POA: Diagnosis not present

## 2022-11-25 DIAGNOSIS — R112 Nausea with vomiting, unspecified: Secondary | ICD-10-CM | POA: Insufficient documentation

## 2022-11-25 LAB — RESP PANEL BY RT-PCR (RSV, FLU A&B, COVID)  RVPGX2
Influenza A by PCR: NEGATIVE
Influenza B by PCR: NEGATIVE
Resp Syncytial Virus by PCR: NEGATIVE
SARS Coronavirus 2 by RT PCR: NEGATIVE

## 2022-11-25 LAB — ETHANOL: Alcohol, Ethyl (B): <10 mg/dL (ref <10)

## 2022-11-25 LAB — BRAIN NATRIURETIC PEPTIDE: B Natriuretic Peptide: 6.6 pg/mL (ref 0.0–100.0)

## 2022-11-25 LAB — LIPASE, BLOOD: Lipase: 29 U/L (ref 11–51)

## 2022-11-25 LAB — TROPONIN I (HIGH SENSITIVITY)
Troponin I (High Sensitivity): 3 ng/L (ref <18)
Troponin I (High Sensitivity): 3 ng/L (ref <18)
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
## 2022-11-25 NOTE — ED Provider Notes (Signed)
[facility] EMERGENCY DEPARTMENT Provider Note  CSN: [id number] Arrival date & time: 11/25/22 1032  Chief Complaint(s) Chest Pain  HPI Victor [name] is a 41 y.o. male with history of bipolar disorder presenting to the emergency department for chest pain.  He reports that he has had chest pain intermittently for the past few weeks.  He reports associated mild shortness of breath.  No recent travel or surgeries.  No nausea or vomiting.  No cough, runny nose.  He also reports that yesterday he developed diarrhea, body aches, nausea and 1 episode of nonbloody vomiting.  He went to an outside ER where he was diagnosed with gastroenteritis.  He reports that he presents today because he does not trust the ER and wants a second opinion   Past Medical History Past Medical History:  Diagnosis Date   Medical history non-contributory    Patient Active Problem List   Diagnosis Date Noted   Bipolar I disorder, most recent episode (or current) manic (HCC) 10/02/2022   Home Medication(s) Prior to Admission medications   Medication Sig Start Date End Date Taking? Authorizing Provider  azithromycin (ZITHROMAX) 250 MG tablet Take 2 tablets (500 mg) on  Day 1,  followed by 1 tablet (250 mg) once daily on Days 2 through 5. 11/03/22  Yes [provider]  promethazine-dextromethorphan (PROMETHAZINE-DM) 6.25-15 MG/5ML syrup Take 5 mLs by mouth 4 (four) times daily as needed. 11/03/22  Yes [provider]  clindamycin (CLEOCIN) 300 MG capsule Oral for 10 Days    [provider]  doxycycline (VIBRAMYCIN) 100 MG capsule Oral for 10 Days    [provider]  predniSONE (DELTASONE) 20 MG tablet Take 2 tablets (40 mg total) by mouth daily. 01/02/21   Benjiman Core, MD  QUEtiapine (SEROQUEL) 50 MG tablet Take 50 mg by mouth at bedtime as needed. Take one to two tablets    [provider]                                                                                                                                     Past Surgical History Past Surgical History:  Procedure Laterality Date   ABDOMINAL SURGERY     NO PAST SURGERIES     Family History History reviewed. No pertinent family history.  Social History Social History   Tobacco Use   Smoking status: Every Day   Smokeless tobacco: Never  Vaping Use   Vaping Use: Never used  Substance Use Topics   Alcohol use: Yes    Comment: occasional   Drug use: Yes    Types: Marijuana    Comment: rarely uses marijuana last used 5 days ago.    Allergies Penicillin g  Review of Systems Review of Systems  All other systems reviewed and are negative.   Physical Exam Vital Signs  I have reviewed the triage vital signs BP 128/81  Pulse 79   Temp 98.4 F (36.9 C) (Oral)   Resp 16   Ht 5\' 8"  (1.727 m)   Wt 97.5 kg   SpO2 96%   BMI 32.69 kg/m  Physical Exam Vitals and nursing note reviewed.  Constitutional:      General: He is not in acute distress.    Appearance: Normal appearance.  HENT:     Mouth/Throat:     Mouth: Mucous membranes are moist.  Eyes:     Conjunctiva/sclera: Conjunctivae normal.  Cardiovascular:     Rate and Rhythm: Normal rate and regular rhythm.  Pulmonary:     Effort: Pulmonary effort is normal. No respiratory distress.     Breath sounds: Normal breath sounds.  Abdominal:     General: Abdomen is flat.     Palpations: Abdomen is soft.     Tenderness: There is no abdominal tenderness.  Musculoskeletal:     Right lower leg: No edema.     Left lower leg: No edema.  Skin:    General: Skin is warm and dry.     Capillary Refill: Capillary refill takes less than 2 seconds.  Neurological:     Mental Status: He is alert and oriented to person, place, and time. Mental status is at baseline.  Psychiatric:        Mood and Affect: Mood normal.        Behavior: Behavior normal.     ED Results and Treatments Labs (all labs ordered are listed,  but only abnormal results are displayed) Labs Reviewed  RESP PANEL BY RT-PCR (RSV, FLU A&B, COVID)  RVPGX2  LIPASE, BLOOD  BRAIN NATRIURETIC PEPTIDE  ETHANOL  TROPONIN I (HIGH SENSITIVITY)  TROPONIN I (HIGH SENSITIVITY)                                                                                                                          Radiology DG Chest 2 View  Result Date: 11/25/2022 CLINICAL DATA:  Chest pain EXAM: CHEST - 2 VIEW COMPARISON:  None Available. FINDINGS: The heart size and mediastinal contours are within normal limits. Both lungs are clear. The visualized skeletal structures are unremarkable. IMPRESSION: Negative. Electronically Signed   By: 11/27/2022 M.D.   On: 11/25/2022 11:55    Pertinent labs & imaging results that were available during my care of the patient were reviewed by me and considered in my medical decision making (see MDM for details).  Medications Ordered in ED Medications - No data to display  Procedures Procedures  (including critical care time)  Medical Decision Making / ED Course   MDM:  41 year old male presenting to the emergency department with chest pain.  Patient overall well-appearing, physical exam unremarkable.  Unclear cause of the patient's chest pain.  Low concern for ACS given age, negative troponin, EKG reassuring without acute ST or T wave changes.  Doubt PE, patient PERC negative.  Chest x-ray without evidence of pneumonia or pneumothorax.  Labs ordered in triage did not include chemistry panel or CBC but reviewed CBC from outside hospital yesterday which shows no anemia.  Given reassuring workup, will recommend outpatient follow-up with his primary doctor for chest pain.  Patient also complaining of nausea vomiting and diarrhea which is improving.  He was diagnosed with gastroenteritis yesterday.   His abdominal exam here is benign.  Low concern for alternative diagnosis.  Doubt acute process such as appendicitis, obstruction, volvulus, invasive colitis.   Will discharge patient to home. All questions answered. Patient comfortable with plan of discharge. Return precautions discussed with patient and specified on the after visit summary.       Additional history obtained: -Additional history obtained from father -External records from outside source obtained and reviewed including: Chart review including previous notes, labs, imaging, consultation notes including outside ER visit yesterday   Lab Tests: -I ordered, reviewed, and interpreted labs.   The pertinent results include:   Labs Reviewed  RESP PANEL BY RT-PCR (RSV, FLU A&B, COVID)  RVPGX2  LIPASE, BLOOD  BRAIN NATRIURETIC PEPTIDE  ETHANOL  TROPONIN I (HIGH SENSITIVITY)  TROPONIN I (HIGH SENSITIVITY)   EKG  See chart. No acute ST changes  Imaging Studies ordered: I ordered imaging studies including CXR On my interpretation imaging demonstrates no acute process I independently visualized and interpreted imaging. I agree with the radiologist interpretation   Medicines ordered and prescription drug management: No orders of the defined types were placed in this encounter.   -I have reviewed the patients home medicines and have made adjustments as needed   Social Determinants of Health:  Diagnosis or treatment significantly limited by social determinants of health: obesity   Reevaluation: After the interventions noted above, I reevaluated the patient and found that they have improved  Co morbidities that complicate the patient evaluation  Past Medical History:  Diagnosis Date   Medical history non-contributory       Dispostion: Disposition decision including need for hospitalization was considered, and patient discharged from emergency department.    Final Clinical Impression(s) / ED Diagnoses Final  diagnoses:  Atypical chest pain     This chart was dictated using voice recognition software.  Despite best efforts to proofread,  errors can occur which can change the documentation meaning.    Lonell Grandchild, MD 11/25/22 2147

## 2022-11-25 NOTE — ED Provider Triage Note (Signed)
Emergency Medicine Provider Triage Evaluation Note  Victor [name] , a 41 y.o. male  was evaluated in triage.  Pt complains of 2 days of chest pain.  He reports that he had been having several weeks of increasing chest pain that he would describe as indigestion.  Denies any prior history of cardiac abnormalities.  Reports associated nausea, vomiting, diarrhea but he feels like this was likely due to the indigestion feeling.  No recent changes to any medications.  Review of Systems  Positive: See above Negative: See above  Physical Exam  BP (!) 148/90   Pulse 84   Temp 98.5 F (36.9 C) (Oral)   Resp 18   Ht 5\' 8"  (1.727 m)   Wt 97.5 kg   SpO2 96%   BMI 32.69 kg/m  Gen:   Awake, no distres Resp:  Normal effort clear to auscultation bilaterally MSK:   Moves extremities without difficulty, no chest wall tenderness Other:  Regular rate and rhythm  Medical Decision Making  Medically screening exam initiated at 11:12 AM.  Appropriate orders placed.  Victor [name] was informed that the remainder of the evaluation will be completed by another provider, this initial triage assessment does not replace that evaluation, and the importance of remaining in the ED until their evaluation is complete.     Evern Core, PA-C 11/25/22 1114

## 2023-07-12 ENCOUNTER — Ambulatory Visit
Admission: EM | Admit: 2023-07-12 | Discharge: 2023-07-12 | Disposition: A | Discharge status: Home / Self Care | Payer: Medicare HMO | Attending: Nurse Practitioner | Admitting: Nurse Practitioner

## 2023-07-12 DIAGNOSIS — R21 Rash and other nonspecific skin eruption: Secondary | ICD-10-CM

## 2023-07-12 MED ORDER — PREDNISONE 20 MG PO TABS: 40.0000 mg | ORAL_TABLET | Freq: Every day | ORAL | 0 refills | Status: AC

## 2023-07-12 MED ORDER — TRIAMCINOLONE ACETONIDE 0.1 % EX CREA: 1.0000 | TOPICAL_CREAM | Freq: Two times a day (BID) | CUTANEOUS | 0 refills | Status: DC
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
## 2023-07-12 NOTE — Discharge Instructions (Addendum)
Take medication as prescribed. May also take over-the-counter Zyrtec during the daytime or Benadryl at bedtime to help with itching.  Avoid hot baths or showers while symptoms persist.  Recommend taking lukewarm baths. May apply cool cloths to the area to help with itching or discomfort. Avoid scratching, rubbing, or manipulating the areas while symptoms persist. Recommend Aveeno colloidal oatmeal bath to use to help with drying and itching. Follow-up if symptoms fail to improve.

## 2023-07-12 NOTE — ED Provider Notes (Signed)
[facility]    CSN: [id number] Arrival date & time: 07/12/23  1702      History   Chief Complaint No chief complaint on file.   HPI Victor [name] is a 42 y.o. male.   The history is provided by the patient.   The patient presents for complaints of rash to the right upper arm.  Patient states 3 to 4 weeks ago, he developed poison ivy.  He states that he had a remaining prescription of prednisone and had been taking that after his symptoms started.  Patient states his symptoms appear to be improving, but then when he stopped the prednisone, it would return.  He states the last time he took the prednisone, the symptoms improved, but he ran out.  Patient thinks he may have gotten into poison ivy when he was working outside.  Patient denies fever, chills, exposure to new soaps, medications, lotions, foods, or detergents.  He also states he has been using Caladryl and salt packs to the area for itching.  Past Medical History:  Diagnosis Date   Medical history non-contributory     Patient Active Problem List   Diagnosis Date Noted   Bipolar I disorder, most recent episode (or current) manic (HCC) 10/02/2022    Past Surgical History:  Procedure Laterality Date   ABDOMINAL SURGERY     NO PAST SURGERIES         Home Medications    Prior to Admission medications   Medication Sig Start Date End Date Taking? Authorizing Provider  predniSONE (DELTASONE) 20 MG tablet Take 2 tablets (40 mg total) by mouth daily with breakfast for 5 days. 07/12/23 07/17/23 Yes Ludie Pavlik-Warren, Sadie Haber, NP  triamcinolone cream (KENALOG) 0.1 % Apply 1 Application topically 2 (two) times daily. 07/12/23  Yes Taiz Bickle-Warren, Sadie Haber, NP    Family History History reviewed. No pertinent family history.  Social History Social History   Tobacco Use   Smoking status: Every Day   Smokeless tobacco: Never  Vaping Use   Vaping status: Never Used  Substance Use Topics   Alcohol use: Yes     Comment: occasional   Drug use: Yes    Types: Marijuana    Comment: rarely uses marijuana last used 5 days ago.      Allergies   Amoxicillin and Penicillin g   Review of Systems Review of Systems Per HPI  Physical Exam Triage Vital Signs ED Triage Vitals  Encounter Vitals Group     BP 07/12/23 1732 (!) 142/86     Systolic BP Percentile --      Diastolic BP Percentile --      Pulse Rate 07/12/23 1732 78     Resp 07/12/23 1732 13     Temp 07/12/23 1732 98.6 F (37 C)     Temp Source 07/12/23 1732 Oral     SpO2 07/12/23 1732 96 %     Weight --      Height --      Head Circumference --      Peak Flow --      Pain Score 07/12/23 1739 0     Pain Loc --      Pain Education --      Exclude from Growth Chart --    No data found.  Updated Vital Signs BP (!) 142/86 (BP Location: Right Arm)   Pulse 78   Temp 98.6 F (37 C) (Oral)   Resp 13   SpO2 96%  Visual Acuity Right Eye Distance:   Left Eye Distance:   Bilateral Distance:    Right Eye Near:   Left Eye Near:    Bilateral Near:     Physical Exam Vitals and nursing note reviewed.  Constitutional:      General: He is not in acute distress.    Appearance: Normal appearance.  Eyes:     Extraocular Movements: Extraocular movements intact.     Pupils: Pupils are equal, round, and reactive to light.  Pulmonary:     Effort: Pulmonary effort is normal.  Musculoskeletal:     Cervical back: Normal range of motion.  Skin:    General: Skin is warm and dry.     Findings: Rash (patchy erythematous rash noted to the posterior right upper forearm. There is no oozing,fluctuance or drainage present.) present.  Neurological:     General: No focal deficit present.     Mental Status: He is alert and oriented to person, place, and time.  Psychiatric:        Mood and Affect: Mood normal.        Behavior: Behavior normal.      UC Treatments / Results  Labs (all labs ordered are listed, but only abnormal results are  displayed) Labs Reviewed - No data to display  EKG   Radiology No results found.  Procedures Procedures (including critical care time)  Medications Ordered in UC Medications - No data to display  Initial Impression / Assessment and Plan / UC Course  I have reviewed the triage vital signs and the nursing notes.  Pertinent labs & imaging results that were available during my care of the patient were reviewed by me and considered in my medical decision making (see chart for details).  The patient is well-appearing, he is in no acute distress, vital signs are stable.  Will treat patient's rash with prednisone 40 mg for the next 5 days and triamcinolone cream 0.1% to apply topically.  Supportive care recommendations were provided and discussed with the patient to include over-the-counter antihistamines for itching, cool compresses to the area, and use of Aveeno colloidal oatmeal bath to help with itching and drying of the rash.  Patient advised to follow-up in this clinic or with his primary care physician if symptoms fail to improve.  Patient is in agreement with this plan of care and verbalizes understanding.  All questions were answered.  Patient stable for discharge.   Final Clinical Impressions(s) / UC Diagnoses   Final diagnoses:  Rash and nonspecific skin eruption     Discharge Instructions      Take medication as prescribed. May also take over-the-counter Zyrtec during the daytime or Benadryl at bedtime to help with itching.  Avoid hot baths or showers while symptoms persist.  Recommend taking lukewarm baths. May apply cool cloths to the area to help with itching or discomfort. Avoid scratching, rubbing, or manipulating the areas while symptoms persist. Recommend Aveeno colloidal oatmeal bath to use to help with drying and itching. Follow-up if symptoms fail to improve.      ED Prescriptions     Medication Sig Dispense Auth. Provider   predniSONE (DELTASONE) 20 MG  tablet Take 2 tablets (40 mg total) by mouth daily with breakfast for 5 days. 10 tablet Joshuajames Moehring-Warren, Sadie Haber, NP   triamcinolone cream (KENALOG) 0.1 % Apply 1 Application topically 2 (two) times daily. 30 g Karl Knarr-Warren, Sadie Haber, NP      PDMP not reviewed this encounter.  Abran Cantor, NP 07/12/23 1811

## 2023-08-09 ENCOUNTER — Other Ambulatory Visit: Payer: Self-pay

## 2023-08-09 ENCOUNTER — Emergency Department (HOSPITAL_COMMUNITY)
Admission: EM | Admit: 2023-08-09 | Discharge: 2023-08-10 | Disposition: A | Discharge status: Home / Self Care | Payer: Medicare HMO | Attending: Emergency Medicine | Admitting: Emergency Medicine

## 2023-08-09 DIAGNOSIS — Y93H2 Activity, gardening and landscaping: Secondary | ICD-10-CM | POA: Insufficient documentation

## 2023-08-09 DIAGNOSIS — L255 Unspecified contact dermatitis due to plants, except food: Secondary | ICD-10-CM | POA: Insufficient documentation

## 2023-08-09 DIAGNOSIS — R21 Rash and other nonspecific skin eruption: Secondary | ICD-10-CM | POA: Diagnosis present

## 2023-08-09 MED ORDER — PREDNISONE 10 MG PO TABS: ORAL_TABLET | ORAL | 0 refills | Status: DC

## 2023-08-09 MED ORDER — DEXAMETHASONE SODIUM PHOSPHATE 10 MG/ML IJ SOLN
10.0000 mg | Freq: Once | INTRAMUSCULAR | Status: AC
  Administered 2023-08-09: 10 mg via INTRAMUSCULAR
  Filled 2023-08-09: qty 1

## 2023-08-09 NOTE — ED Provider Notes (Signed)
[facility] EMERGENCY DEPARTMENT AT [facility] Provider Note   CSN: [id number] Arrival date & time: 08/09/23  1903     History  No chief complaint on file.   Victor [name] is a 42 y.o. male.  HPI      Victor [name] is a 42 y.o. male who presents to the Emergency Department complaining of itching and rash intermittently x 1 month.  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  Frequently exposed to poison ivy.  Was seen 1 month ago at urgent care.  Was given short course of steroids with temporary relief of symptoms.  Comes in this evening with rash to his bilateral arms, genitals, lower abdomen, and bilateral legs.  Describes significant itching.  Is used calamine lotion with minimal relief.  Is also used steroid cream with minimal relief.  Denies any chest pain, shortness of breath, facial swelling, swelling oflips or tongue.   Home Medications Prior to Admission medications   Medication Sig Start Date End Date Taking? Authorizing Provider  triamcinolone cream (KENALOG) 0.1 % Apply 1 Application topically 2 (two) times daily. 07/12/23   Leath-Warren, Sadie Haber, NP      Allergies    Amoxicillin and Penicillin g    Review of Systems   Review of Systems  Constitutional:  Negative for chills and fever.  HENT:  Negative for congestion.   Eyes:  Negative for visual disturbance.  Respiratory:  Negative for cough and shortness of breath.   Cardiovascular:  Negative for chest pain.  Gastrointestinal:  Negative for abdominal pain, nausea and vomiting.  Genitourinary:  Negative for dysuria.  Musculoskeletal:  Negative for arthralgias, back pain and myalgias.  Skin:  Positive for rash.  Neurological:  Negative for facial asymmetry, weakness, numbness and headaches.  Psychiatric/Behavioral:  Negative for confusion.     Physical Exam Updated Vital Signs BP 136/87   Pulse 83   Temp 98.3 F (36.8 C) (Oral)   Resp 19   SpO2 98%   Physical Exam Vitals and nursing note reviewed.  Constitutional:      General: He is not in acute distress.    Appearance: Normal appearance. He is not ill-appearing or toxic-appearing.  Cardiovascular:     Rate and Rhythm: Normal rate and regular rhythm.     Pulses: Normal pulses.  Pulmonary:     Effort: Pulmonary effort is normal. No respiratory distress.  Musculoskeletal:        General: No swelling.     Right lower leg: No edema.     Left lower leg: No edema.  Skin:    General: Skin is warm.     Capillary Refill: Capillary refill takes less than 2 seconds.     Findings: Erythema and rash present.     Comments: Maculopapular rash with erythema scattered to the bilateral upper and lower extremities, lower trunk.  Some in linear distribution.  No vesicles.  No edema  Neurological:     General: No focal deficit present.     Mental Status: He is alert.     Motor: No weakness.     ED Results / Procedures / Treatments   Labs (all labs ordered are listed, but only abnormal results are displayed) Labs Reviewed - No data to display  EKG None  Radiology No results found.  Procedures Procedures    Medications Ordered in ED Medications  dexamethasone (DECADRON) injection 10 mg (has no administration in time range)  ED Course/ Medical Decision Making/ A&P                                 Medical Decision Making Patient here with itching and rash waxing and waning x 1 month.  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  Has frequent exposure to poison oak poison ivy.  Denies any airway involvement, or swelling  Patient well-appearing.  Rash appears consistent with plant dermatitis.  Will treat with steroids.  He is not to take Benadryl as he has a small child at home to care for  Amount and/or Complexity of Data Reviewed Discussion of management or test interpretation with external provider(s): IM Decadron given here, steroid taper prescription.   Recommended calamine lotion and Aveeno oatmeal bath to help control itching.  He appears appropriate for discharge home.  Risk Prescription drug management.           Final Clinical Impression(s) / ED Diagnoses Final diagnoses:  Plant dermatitis    Rx / DC Orders ED Discharge Orders     None         Pauline Aus, PA-C 08/09/23 2315    Terrilee Files, MD 08/10/23 1055

## 2023-08-09 NOTE — ED Triage Notes (Signed)
Pt states he was seen at UC 1 month ago for poison ivy that was on his arms and was given prednisone and kenalog cream. States it has not gone away and has spread to his neck, face, and testicles.

## 2023-08-09 NOTE — ED Notes (Signed)
Pt has a rash to right upper arm, left lower arm, left side of his neck, his lower abd that was visible. Pt also states that he has a red rash on testicles. Pt states he did not see the poison ivy that he was exposed to.

## 2023-08-09 NOTE — Discharge Instructions (Signed)
You may use over-the-counter calamine lotion or Aveeno oatmeal bath to help control itching.  Avoid sweating or hot steamy showers as heat typically makes the itching worse.  Start the prednisone prescription tomorrow.  Follow-up with your primary care provider for recheck if needed.

## 2023-08-10 ENCOUNTER — Ambulatory Visit: Payer: Medicare HMO

## 2024-02-18 ENCOUNTER — Other Ambulatory Visit: Payer: Self-pay

## 2024-02-18 ENCOUNTER — Emergency Department (HOSPITAL_COMMUNITY)
Admission: EM | Admit: 2024-02-18 | Discharge: 2024-02-19 | Disposition: A | Discharge status: Other Acute Inpatient Hospital | Attending: Emergency Medicine | Admitting: Emergency Medicine

## 2024-02-18 DIAGNOSIS — F319 Bipolar disorder, unspecified: Secondary | ICD-10-CM | POA: Diagnosis not present

## 2024-02-18 DIAGNOSIS — R440 Auditory hallucinations: Secondary | ICD-10-CM | POA: Diagnosis present

## 2024-02-18 DIAGNOSIS — F22 Delusional disorders: Secondary | ICD-10-CM

## 2024-02-18 LAB — CBC WITH DIFFERENTIAL/PLATELET
Abs Immature Granulocytes: 0.03 10*3/uL (ref 0.00–0.07)
Basophils Absolute: 0 10*3/uL (ref 0.0–0.1)
Basophils Relative: 0 %
Eosinophils Absolute: 0.1 10*3/uL (ref 0.0–0.5)
Eosinophils Relative: 2 %
HCT: 40.4 % (ref 39.0–52.0)
Hemoglobin: 13.8 g/dL (ref 13.0–17.0)
Immature Granulocytes: 0 %
Lymphocytes Relative: 17 %
Lymphs Abs: 1.3 10*3/uL (ref 0.7–4.0)
MCH: 29.8 pg (ref 26.0–34.0)
MCHC: 34.2 g/dL (ref 30.0–36.0)
MCV: 87.3 fL (ref 80.0–100.0)
Monocytes Absolute: 0.6 10*3/uL (ref 0.1–1.0)
Monocytes Relative: 8 %
Neutro Abs: 5.2 10*3/uL (ref 1.7–7.7)
Neutrophils Relative %: 73 %
Platelets: 227 10*3/uL (ref 150–400)
RBC: 4.63 MIL/uL (ref 4.22–5.81)
RDW: 12.7 % (ref 11.5–15.5)
WBC: 7.2 10*3/uL (ref 4.0–10.5)
nRBC: 0 % (ref 0.0–0.2)

## 2024-02-18 LAB — ETHANOL: Alcohol, Ethyl (B): <10 mg/dL (ref <10)

## 2024-02-18 LAB — COMPREHENSIVE METABOLIC PANEL
ALT: 28 U/L (ref 0–44)
AST: 29 U/L (ref 15–41)
Albumin: 4.5 g/dL (ref 3.5–5.0)
Alkaline Phosphatase: 64 U/L (ref 38–126)
Anion gap: 11 (ref 5–15)
BUN: 19 mg/dL (ref 6–20)
CO2: 22 mmol/L (ref 22–32)
Calcium: 9.3 mg/dL (ref 8.9–10.3)
Chloride: 105 mmol/L (ref 98–111)
Creatinine, Ser: 0.64 mg/dL (ref 0.61–1.24)
GFR, Estimated: >60 mL/min (ref >60)
Glucose, Bld: 103 mg/dL — ABNORMAL HIGH (ref 70–99)
Potassium: 3.4 mmol/L — ABNORMAL LOW (ref 3.5–5.1)
Sodium: 138 mmol/L (ref 135–145)
Total Bilirubin: 0.9 mg/dL (ref 0.0–1.2)
Total Protein: 7.4 g/dL (ref 6.5–8.1)

## 2024-02-18 LAB — RAPID URINE DRUG SCREEN, HOSP PERFORMED
Amphetamines: NOT DETECTED
Barbiturates: NOT DETECTED
Benzodiazepines: NOT DETECTED
Cocaine: NOT DETECTED
Opiates: NOT DETECTED
Tetrahydrocannabinol: POSITIVE — AB

## 2024-02-18 MED ORDER — IBUPROFEN 400 MG PO TABS: 600.0000 mg | ORAL_TABLET | Freq: Three times a day (TID) | ORAL | Status: DC | PRN

## 2024-02-18 MED ORDER — ALUM & MAG HYDROXIDE-SIMETH 200-200-20 MG/5ML PO SUSP: 30.0000 mL | Freq: Four times a day (QID) | ORAL | Status: DC | PRN

## 2024-02-18 MED ORDER — ONDANSETRON HCL 4 MG PO TABS: 4.0000 mg | ORAL_TABLET | Freq: Three times a day (TID) | ORAL | Status: DC | PRN

## 2024-02-18 MED ORDER — ZOLPIDEM TARTRATE 5 MG PO TABS: 5.0000 mg | ORAL_TABLET | Freq: Every evening | ORAL | Status: DC | PRN

## 2024-02-18 MED ORDER — ZIPRASIDONE HCL 20 MG PO CAPS: 20.0000 mg | ORAL_CAPSULE | Freq: Once | ORAL | Status: DC

## 2024-02-18 MED ORDER — LORAZEPAM 1 MG PO TABS: 1.0000 mg | ORAL_TABLET | Freq: Once | ORAL | Status: DC

## 2024-02-18 NOTE — ED Notes (Signed)
 Pt care taken, is talking to TTS.

## 2024-02-18 NOTE — ED Provider Notes (Signed)
 11:02 PM Assumed care from Dr. Hyacinth Meeker, please see their note for full history, physical and decision making until this point. In brief this is a 43 y.o. year old male who presented to the ED tonight with V70.1     Manic, IVC, aggressive previously. Inpatient recommended. Pending placement  Medically cleared for psychiatric care. Pending inpatient placement. No other issues overnight.    Labs, studies and imaging reviewed by myself and considered in medical decision making if ordered. Imaging interpreted by radiology.  Labs Reviewed  COMPREHENSIVE METABOLIC PANEL - Abnormal; Notable for the following components:      Result Value   Potassium 3.4 (*)    Glucose, Bld 103 (*)    All other components within normal limits  RAPID URINE DRUG SCREEN, HOSP PERFORMED - Abnormal; Notable for the following components:   Tetrahydrocannabinol POSITIVE (*)    All other components within normal limits  ETHANOL  CBC WITH DIFFERENTIAL/PLATELET    No orders to display    No follow-ups on file.    Larry Alcock, Barbara Cower, MD 02/19/24 [phone]

## 2024-02-18 NOTE — ED Notes (Signed)
 Pt just finished eating mac and cheese.
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
## 2024-02-18 NOTE — ED Notes (Signed)
 IVC PAPERWORK FAXED TO [facility] @ [phone] & [phone]

## 2024-02-18 NOTE — ED Notes (Addendum)
 Pt dresses out in appropriate burgundy scrubs, security called to wand pt--pt wanded by security

## 2024-02-18 NOTE — ED Notes (Signed)
Patient currently in the shower.

## 2024-02-18 NOTE — ED Triage Notes (Signed)
 Pt arrived via [facility] under IVC that was taken out by his mother/family member, per [facility], mother took out IVC papers on pt due to pts manic behavior. Pt denies SI/HI at this time
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
## 2024-02-18 NOTE — ED Notes (Signed)
 Pt dressed out into [facility] appropriate scrubs, belongings placed into belongings bag include wallet, cell phone, sandals, sweatpants, t shirt, bag labeled and placed into pt locker and locker labeled.

## 2024-02-18 NOTE — ED Notes (Signed)
 Pt very agitated and upset after talking to TTS.

## 2024-02-19 ENCOUNTER — Encounter (HOSPITAL_COMMUNITY): Payer: Self-pay | Admitting: Nurse Practitioner

## 2024-02-19 ENCOUNTER — Other Ambulatory Visit: Payer: Self-pay

## 2024-02-19 ENCOUNTER — Inpatient Hospital Stay (HOSPITAL_COMMUNITY)
Admission: AD | Admit: 2024-02-19 | Discharge: 2024-02-24 | DRG: 885 | Disposition: A | Discharge status: Home / Self Care | Source: Intra-hospital | Attending: Psychiatry | Admitting: Psychiatry

## 2024-02-19 DIAGNOSIS — Z91199 Patient's noncompliance with other medical treatment and regimen due to unspecified reason: Secondary | ICD-10-CM

## 2024-02-19 DIAGNOSIS — R682 Dry mouth, unspecified: Secondary | ICD-10-CM | POA: Diagnosis present

## 2024-02-19 DIAGNOSIS — E876 Hypokalemia: Secondary | ICD-10-CM | POA: Diagnosis present

## 2024-02-19 DIAGNOSIS — Z818 Family history of other mental and behavioral disorders: Secondary | ICD-10-CM | POA: Diagnosis not present

## 2024-02-19 DIAGNOSIS — R339 Retention of urine, unspecified: Secondary | ICD-10-CM | POA: Diagnosis present

## 2024-02-19 DIAGNOSIS — F319 Bipolar disorder, unspecified: Secondary | ICD-10-CM | POA: Diagnosis not present

## 2024-02-19 DIAGNOSIS — F22 Delusional disorders: Secondary | ICD-10-CM | POA: Diagnosis present

## 2024-02-19 DIAGNOSIS — F3112 Bipolar disorder, current episode manic without psychotic features, moderate: Secondary | ICD-10-CM | POA: Diagnosis present

## 2024-02-19 DIAGNOSIS — F909 Attention-deficit hyperactivity disorder, unspecified type: Secondary | ICD-10-CM | POA: Diagnosis present

## 2024-02-19 DIAGNOSIS — F17210 Nicotine dependence, cigarettes, uncomplicated: Secondary | ICD-10-CM | POA: Diagnosis present

## 2024-02-19 DIAGNOSIS — Z560 Unemployment, unspecified: Secondary | ICD-10-CM | POA: Diagnosis not present

## 2024-02-19 DIAGNOSIS — Z880 Allergy status to penicillin: Secondary | ICD-10-CM | POA: Diagnosis not present

## 2024-02-19 MED ORDER — ZOLPIDEM TARTRATE 5 MG PO TABS
5.0000 mg | ORAL_TABLET | Freq: Every evening | ORAL | Status: DC | PRN
  Administered 2024-02-23: 5 mg via ORAL
  Filled 2024-02-19: qty 1

## 2024-02-19 MED ORDER — ALUM & MAG HYDROXIDE-SIMETH 200-200-20 MG/5ML PO SUSP: 30.0000 mL | Freq: Four times a day (QID) | ORAL | Status: DC | PRN

## 2024-02-19 MED ORDER — HALOPERIDOL 5 MG PO TABS: 5.0000 mg | ORAL_TABLET | Freq: Three times a day (TID) | ORAL | Status: DC | PRN

## 2024-02-19 MED ORDER — MAGNESIUM HYDROXIDE 400 MG/5ML PO SUSP: 30.0000 mL | Freq: Every day | ORAL | Status: DC | PRN

## 2024-02-19 MED ORDER — LORAZEPAM 2 MG/ML IJ SOLN
2.0000 mg | Freq: Three times a day (TID) | INTRAMUSCULAR | Status: DC | PRN
  Filled 2024-02-19: qty 1

## 2024-02-19 MED ORDER — DIPHENHYDRAMINE HCL 50 MG/ML IJ SOLN
50.0000 mg | Freq: Three times a day (TID) | INTRAMUSCULAR | Status: DC | PRN
  Administered 2024-02-20: 50 mg via INTRAMUSCULAR

## 2024-02-19 MED ORDER — LORAZEPAM 2 MG/ML IJ SOLN
2.0000 mg | Freq: Three times a day (TID) | INTRAMUSCULAR | Status: DC | PRN
  Administered 2024-02-20: 2 mg via INTRAMUSCULAR

## 2024-02-19 MED ORDER — OLANZAPINE 5 MG PO TABS
5.0000 mg | ORAL_TABLET | Freq: Every day | ORAL | Status: DC
  Filled 2024-02-19 (×2): qty 1

## 2024-02-19 MED ORDER — IBUPROFEN 600 MG PO TABS: 600.0000 mg | ORAL_TABLET | Freq: Three times a day (TID) | ORAL | Status: DC | PRN

## 2024-02-19 MED ORDER — DIPHENHYDRAMINE HCL 25 MG PO CAPS: 50.0000 mg | ORAL_CAPSULE | Freq: Three times a day (TID) | ORAL | Status: DC | PRN

## 2024-02-19 MED ORDER — ONDANSETRON HCL 4 MG PO TABS: 4.0000 mg | ORAL_TABLET | Freq: Three times a day (TID) | ORAL | Status: DC | PRN

## 2024-02-19 MED ORDER — ACETAMINOPHEN 325 MG PO TABS
650.0000 mg | ORAL_TABLET | Freq: Four times a day (QID) | ORAL | Status: DC | PRN
Start: 2024-02-19 — End: 2024-02-24

## 2024-02-19 MED ORDER — HALOPERIDOL LACTATE 5 MG/ML IJ SOLN
10.0000 mg | Freq: Three times a day (TID) | INTRAMUSCULAR | Status: DC | PRN
  Administered 2024-02-20: 10 mg via INTRAMUSCULAR
  Filled 2024-02-19: qty 2

## 2024-02-19 MED ORDER — HYDROXYZINE HCL 25 MG PO TABS: 25.0000 mg | ORAL_TABLET | Freq: Three times a day (TID) | ORAL | Status: DC | PRN

## 2024-02-19 MED ORDER — DIPHENHYDRAMINE HCL 50 MG/ML IJ SOLN
50.0000 mg | Freq: Three times a day (TID) | INTRAMUSCULAR | Status: DC | PRN
  Filled 2024-02-19: qty 1

## 2024-02-19 MED ORDER — HALOPERIDOL LACTATE 5 MG/ML IJ SOLN
5.0000 mg | Freq: Three times a day (TID) | INTRAMUSCULAR | Status: DC | PRN
  Filled 2024-02-19: qty 1

## 2024-02-19 NOTE — ED Provider Notes (Signed)
 Emergency Medicine Observation Re-evaluation Note  Victor [name] is a 43 y.o. male, seen on rounds today.  Pt initially presented to the ED for complaints of V70.1 Currently, the patient is not having any acute complaints.  Physical Exam  BP 127/87 (BP Location: Left Arm)   Pulse 75   Temp 98.6 F (37 C) (Oral)   Resp 16   SpO2 98%  Physical Exam General: Walking on room talking to staff Lungs: Normal work of breathing Psych: Calm   ED Course / MDM  EKG:   I have reviewed the labs performed to date as well as medications administered while in observation.  Recent changes in the last 24 hours include recommended for inpatient treatment.  Medically cleared.  Plan  Current plan is for inpatient treatment.    Rondel Baton, MD 02/19/24 [phone]

## 2024-02-19 NOTE — ED Notes (Signed)
 Pt belongings containing a wallet, hat, flip flops, sweater, bible given to [facility] at this time.
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
## 2024-02-19 NOTE — Group Note (Unsigned)
 Date:  02/19/2024 Time:  9:17 PM  Group Topic/Focus:  Wrap-Up Group:   The focus of this group is to help patients review their daily goal of treatment and discuss progress on daily workbooks.     Participation Level:  {BHH PARTICIPATION VHQIO:96295}  Participation Quality:  {BHH PARTICIPATION QUALITY:22265}  Affect:  {BHH AFFECT:22266}  Cognitive:  {BHH COGNITIVE:22267}  Insight: {BHH Insight2:20797}  Engagement in Group:  {BHH ENGAGEMENT IN MWUXL:24401}  Modes of Intervention:  {BHH MODES OF INTERVENTION:22269}  Additional Comments:  ***  Scot Dock 02/19/2024, 9:17 PM

## 2024-02-19 NOTE — ED Notes (Signed)
 Patient required an EKG and he refused, so tech was unable to get it completed.

## 2024-02-19 NOTE — Tx Team (Signed)
 Initial Treatment Plan 02/19/2024 6:56 PM Evern Core [id number]    PATIENT STRESSORS: Financial difficulties     PATIENT STRENGTHS: Active sense of humor  Supportive family/friends    PATIENT IDENTIFIED PROBLEMS: Restlessness  Anxiety  Anger  Agitation                DISCHARGE CRITERIA:  Safe-care adequate arrangements made  PRELIMINARY DISCHARGE PLAN: Return to previous living arrangement  PATIENT/FAMILY INVOLVEMENT: This treatment plan has been presented to and reviewed with the patient, Victor [name], has been given the opportunity to ask questions and make suggestions.  Melvenia Needles, RN 02/19/2024, 6:56 PM

## 2024-02-19 NOTE — ED Notes (Signed)
Pt made second phone call. 

## 2024-02-19 NOTE — Progress Notes (Signed)
 Pt has been accepted to [facility] on 02/19/2024 Bed assignment: 508-1   Pt meets inpatient criteria per: [name] NP  Attending Physician will be: Dr. Phineas Inches, MD   Report can be called to: Adult unit: [phone]  Pt can arrive after CONSENTS  Care Team Notified: [facility] Rona Ravens RN, [name] NP. Lorella Nimrod RN, Myrlene Broker RN, Dionisio Paschal NT, Haze Justin RN   [place] Belicia Difatta LCSW-A   02/19/2024 11:53 AM

## 2024-02-19 NOTE — ED Notes (Signed)
Pt made first phone call  

## 2024-02-19 NOTE — ED Notes (Signed)
 Patient's father called to let us know he has a court appearance tomorrow, 02/20/24 at 0900. His attorney, Lucious Groves, is able to get a postponement if we could send a fax showing he is IVC here to fax: [phone] / ph# [phone].

## 2024-02-19 NOTE — Plan of Care (Signed)
  Problem: Coping: Goal: Ability to verbalize frustrations and anger appropriately will improve Outcome: Progressing   Problem: Activity: Goal: Interest or engagement in activities will improve Outcome: Progressing

## 2024-02-20 DIAGNOSIS — F3112 Bipolar disorder, current episode manic without psychotic features, moderate: Secondary | ICD-10-CM | POA: Diagnosis not present

## 2024-02-20 MED ORDER — BENZTROPINE MESYLATE 1 MG PO TABS
1.0000 mg | ORAL_TABLET | Freq: Two times a day (BID) | ORAL | Status: DC
  Filled 2024-02-20 (×5): qty 1

## 2024-02-20 MED ORDER — HALOPERIDOL 5 MG PO TABS
5.0000 mg | ORAL_TABLET | Freq: Two times a day (BID) | ORAL | Status: DC
  Filled 2024-02-20 (×5): qty 1

## 2024-02-20 NOTE — BHH Group Notes (Signed)
 Adult Psychoeducational Group Note  Date:  02/20/2024 Time:  4:51 PM  Group Topic/Focus:  Goals Group:   The focus of this group is to help patients establish daily goals to achieve during treatment and discuss how the patient can incorporate goal setting into their daily lives to aide in recovery. Orientation:   The focus of this group is to educate the patient on the purpose and policies of crisis stabilization and provide a format to answer questions about their admission.  The group details unit policies and expectations of patients while admitted.  Participation Level:  Did Not Attend  Participation Quality:    Affect:    Cognitive:    Insight:   Engagement in Group:    Modes of Intervention:    Additional Comments:    Sheran Lawless 02/20/2024, 4:51 PM

## 2024-02-20 NOTE — Plan of Care (Signed)

## 2024-02-20 NOTE — Progress Notes (Signed)
 [facility] Second Physician Opinion Progress Note for Medication Administration to Non-consenting Patients (For Involuntarily Committed Patients)  Patient: Victor [name]  Date of Birth: October 17, 1981  MRN: [id number]   Reason for the Medication: The patient, without the benefit of the specific treatment measure, is incapable of participating in any available treatment plan that will give the patient a realistic opportunity of improving the patient's condition.   Consideration of Side Effects: Consideration of the side effects related to the medication plan has been given.  Rationale for Medication Administration: Consultation for forced medication protocol was requested by Dr. Birdena Crandall because the patient is not consistently taking his medication which has resulted in acute psychiatric decompensation.  Patient has not been engaging in his treatment plans.  He has been irritable and paranoid.  I met with this patient in hallway outside his room.  He was screaming at staff stating that they were "trying to drug me and I don't need drugs!".  On interview the patient cannot explain the reason for admission, cannot articulate an understanding of his psychiatric diagnosis, and cannot discuss risks/benefits to his medications.  He was  too enraged to answer questions when asked about SI or HI and makes frequent statements that she believes the physicians are trying to control him.    The patient currently is too symptomatic to engage in meaningful conversations regarding the treatment of her psychiatric illness and is unlikely to improve without administration of forced medications. He is not able to participate in a meaningful way in decisions regarding medications or treatment. 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 It is my opinion that should the patient refuse  medications that this would lead to more risk of potential self-harm than if he was on a protocol for medications against objection.  I agree with Dr. Birdena Crandall and Dr. Abbott Pao in this assessment.     This documentation is good for (7) seven days from the date of the MD signature. New documentation must be completed every seven (7) days with detailed justification in the medical record if the patient requires continued non-emergent administration of psychotropic medications.

## 2024-02-20 NOTE — BHH Group Notes (Signed)
 Pt did attend wrap up group but refused to participate. Only wanting to discuss D/C
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
## 2024-02-20 NOTE — Progress Notes (Signed)
 [facility] Physician Progress Note for Medication Administration to Non-consenting Patients (For Involuntarily Committed Patients)  Patient: Victor [name]  Date of Birth: 1981-08-07  MRN: [id number]   Reason for the Medication: The patient, without the benefit of the specific treatment measure, is incapable of participating in any available treatment plan that will give the patient a realistic opportunity of improving the patient's condition.   Consideration of Side Effects: Consideration of the side effects related to the medication plan has been given.  Rationale for Medication Administration: Consultation for forced medication protocol was requested because the patient is not consistently taking his medication which has resulted in acute psychiatric decompensation. He has been irritable and paranoid. Patient has not been engaging in his treatment plans.  I met with this patient outside his room in attempt to verbally de-escalate. Patient was initially evaluated on the hallway, after patient had declined taking oral agitation meds. Patient was  irritable, agitated, shouting, pressured speech and disorganized in his thought process. As a result IM agitation medications were drawn and the patient was attempting to refuse meds.  At that time patient stated," I am not on drugs and I do not need any drugs to help me, my mom is the person with depression and schizophrenia, she is the reason why I am in here right now, I have a 55-year-old son who I need to take care of and I do not trust him with my parents".   I attempted to verbally de-escalate the patient and escorted the patient back to his room alongside 2 nurse techs. Patient was more cooperative, however he continued to be disorganized with his thought process, labile and irritability.  On interview the patient cannot explain his reason for admission, cannot articulate an understanding of his psychiatric diagnosis, and cannot discuss risks/benefits to  his medications.  He was too agitated  answer questions when asked about SI or HI and makes frequent statements that he believes the physicians are trying to control him.   The patient currently is too symptomatic to engage in meaningful conversations regarding the treatment of her psychiatric illness and is unlikely to improve without administration of forced medications. He is not able to participate in a meaningful way in decisions regarding medications or treatment. 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 It is my opinion that should the patient refuse medications that this would lead to more risk of potential self-harm than if he was on a protocol for medications against objection.   This documentation is good for (7) seven days from the date of the MD signature. New documentation must be completed every seven (7) days with detailed justification in the medical record if the patient requires continued non-emergent administration of psychotropic medications.

## 2024-02-20 NOTE — BHH Suicide Risk Assessment (Signed)
 [facility] Admission Suicide Risk Assessment  Nursing information obtained from:  Patient Demographic factors:  Male, Caucasian Current Mental Status:  NA Loss Factors:  NA Historical Factors:  NA Risk Reduction Factors:  NA  Total Time spent with patient: 45 minutes Principal Problem: Bipolar 1 disorder with moderate mania (HCC) Diagnosis:  Principal Problem:   Bipolar 1 disorder with moderate mania (HCC)   Subjective Data:   History of Present Illness:  Victor [name] is a 43 y.o., male with a past psychiatric history of bipolar disorder. He was placed under IVC by his mother for persecutory hallucinations and delusions about the government. He was evaluated at [facility] ED and admitted to the [facility] for symptomatic and medication management of manic and psychotic behavior.   Patient was initially evaluated on the hallway, after patient had declined taking oral agitation meds. Patient was  irritable, agitated, shouting, pressured speech and disorganized in his thought process. As a result IM agitation medications were drawn and the patient was attempting to refuse meds.  At that time patient stated," I am not on drugs and I do not need any drugs to help me, my mom is the person with depression and schizophrenia, she is the reason why I am in here right now, I have a 25-year-old son who I need to take care of and I do not trust him with my parents".   I attempted to verbally de-escalate the patient and escorted the patient back to his room alongside 2 nurse techs. Patient was more cooperative, however he continued to be disorganized with his thought process, labile and irritability.  Stating," this all started back in 1988 when I was improperly diagnosed with ADHD and started on Ritalin, years later ended up in the behavioral health Hospital in 2002 where I was diagnosed with bipolar disorder".   Patient reports noncompliance on psychiatric medications in the past  and per report used to go to [facility] "I don't need to be on fucking meds".  Patient reports that," I do not need drugs, my normal is not like everyone's normal, the medications always decreased my energy and may be more tired".  Patient continues to be agitated, combative, refusing request for IM agitation meds. After multiple attempts by various providers meds were administered.    Patient is a poor historian and was unable to give a linear story. On assessment, patient demonstrated the following manic symptoms of increased irritability    Chart review: On chart review, prior to this evaluation, patient was related [facility] for a psychiatric evaluation.  Patient was IVC by his mother for persecutory hallucinations and delusions about the government.  Per the IVC the patient has been refusing his medications, complaining of hearing voices and not been sleeping at all.Labs notable for hypokalemia of 3.4 and UDS positive for THC.   Subjective Sleep past 24 hours: poor  Subjective Appetite past 24 hours: unable to access     Information was collected with the assistance of the EMR    Past Psychiatric History:  Previous psych diagnoses: Bipolar disorder Prior inpatient psychiatric treatment: Patient reports prior admission to behavioral Hospital in [place], with [facility]: Behavioral health hospitalization was back in May 2002, unable to access EMR records.  Prior outpatient psychiatric treatment: [facility]  Current psychiatric provider: Denies   Neuromodulation history: not assessed and denies   Current therapist: Denies Psychotherapy hx: N/A   History of suicide attempts: unable to access History of homicide:  unable to access   Psychotropic medications: Current None    Past Seroquel   Substance Use History: Alcohol: unable to access Hx withdrawal tremors/shakes: unable to access Hx alcohol related blackouts: unable to access Hx alcohol induced hallucinations: unable to  access Hx alcoholic seizures: unable to access Hx medical hospitalization due to severe alcohol withdrawal symptoms: unable to access DUI: unable to access   --------   Tobacco: unable to access Cannabis (marijuana): Yes, positive UDS  Cocaine: unable to access Methamphetamines: unable to access Psilocybin (mushrooms): unable to access Ecstasy (MDMA / molly): unable to access LSD (acid): unable to access Opiates (fentanyl / heroin): unable to access Benzos (Xanax, Klonopin): unable to access IV drug use: unable to access Prescribed meds abuse: unable to access   History of detox: unable to access History of rehab: unable to access   Is the patient at risk to self? Unknown Has the patient been a risk to self in the past 6 months? Unknown Has the patient been a risk to self within the distant past? Unknown Is the patient a risk to others? Yes Has the patient been a risk to others in the past 6 months? Yes Has the patient been a risk to others within the distant past? Yes  Continued Clinical Symptoms:  Alcohol Use Disorder Identification Test Final Score (AUDIT): 0 The "Alcohol Use Disorders Identification Test", Guidelines for Use in Primary Care, Second Edition.  World Science writer [facility]). Score between 0-7:  no or low risk or alcohol related problems. Score between 8-15:  moderate risk of alcohol related problems. Score between 16-19:  high risk of alcohol related problems. Score 20 or above:  warrants further diagnostic evaluation for alcohol dependence and treatment.  CLINICAL FACTORS:   Bipolar Disorder:   Mixed State Unstable or Poor Therapeutic Relationship Previous Psychiatric Diagnoses and Treatments  Musculoskeletal: Strength & Muscle Tone: within normal limits Gait & Station: normal Patient leans: N/A  Psychiatric Specialty Exam  Presentation  General Appearance:  Appropriate for Environment  Eye Contact: Fleeting  Speech: Pressured  Speech  Volume: Increased  Handedness: Right   Mood and Affect  Mood: Angry; Anxious; Labile; Irritable  Duration of Depression Symptoms: No data recorded Affect: Congruent; Labile; Full Range   Thought Process  Thought Processes: Disorganized  Descriptions of Associations: Loose  Orientation: Other (comment) (unable to access)  Thought Content: Illogical; Perseveration; Paranoid Ideation; Delusions; Scattered  History of Schizophrenia/Schizoaffective disorder: No  Duration of Psychotic Symptoms: Less than six months  Hallucinations:No data recorded Ideas of Reference: Paranoia; Delusions; Percusatory  Suicidal Thoughts: Suicidal Thoughts: No  Homicidal Thoughts: Homicidal Thoughts: No   Sensorium  Memory: Recent Poor; Immediate Poor; Remote Poor  Judgment: Impaired  Insight: Lacking   Executive Functions  Concentration: Poor  Attention Span: Poor  Recall: Poor  Fund of Knowledge: Poor  Language: Fair   Psychomotor Activity  Psychomotor Activity: Psychomotor Activity: Increased; Restlessness   Assets  Assets: Physical Health   Sleep  Sleep: Sleep: -- (unable to access)  Review of Systems Review of Systems  Constitutional:  Negative for diaphoresis.  Respiratory:  Negative for cough.   Neurological:  Negative for seizures.  Psychiatric/Behavioral:  Negative for depression. The patient is nervous/anxious and has insomnia.     Vital signs: Blood pressure 129/79, pulse 61, temperature 97.6 F (36.4 C), temperature source Oral, resp. rate 20, height 5\' 7"  (1.702 m), weight 83 kg, SpO2 99%. Body mass index is 28.66 kg/m. Physical Exam Eyes:  Conjunctiva/sclera: Conjunctivae normal.  Pulmonary:     Effort: Pulmonary effort is normal.  Musculoskeletal:        General: Normal range of motion.  Neurological:     Mental Status: He is alert. He is disoriented.  Psychiatric:        Attention and Perception: He does not  perceive auditory or visual hallucinations.        Mood and Affect: Mood is anxious and elated. Affect is labile and angry.        Speech: Speech is rapid and pressured.        Behavior: Behavior is uncooperative, agitated, aggressive and combative.        Thought Content: Thought content is paranoid and delusional.   COGNITIVE FEATURES THAT CONTRIBUTE TO RISK:  Closed-mindedness    SUICIDE RISK:  Minimal: No identifiable suicidal ideation.  Patients presenting with no risk factors but with morbid ruminations; may be classified as minimal risk based on the severity of the depressive symptoms   PLAN OF CARE: see H&P for full plan of care  I certify that inpatient services furnished can reasonably be expected to improve the patient's condition.   Signed: [name], MD 02/20/2024, 9:12 PM

## 2024-02-20 NOTE — Progress Notes (Signed)
   02/20/24 2100  Psych Admission Type (Psych Patients Only)  Admission Status Involuntary  Psychosocial Assessment  Patient Complaints Agitation;Irritability  Eye Contact Intense  Facial Expression Angry;Animated  Affect Apprehensive;Labile  Speech Aggressive;Argumentative  Interaction Defensive;Dominating  Motor Activity Fidgety;Pacing  Appearance/Hygiene Disheveled  Behavior Characteristics Agressive verbally;Agitated  Mood Angry  Thought Process  Coherency Concrete thinking  Content Blaming others  Delusions None reported or observed  Perception WDL  Hallucination None reported or observed  Judgment Poor  Confusion None  Danger to Self  Current suicidal ideation? Denies  Danger to Others  Danger to Others None reported or observed

## 2024-02-20 NOTE — Plan of Care (Deleted)
 [facility] Physician Progress Note for Medication Administration to Non-consenting Patients (For Involuntarily Committed Patients)  Patient: Victor [name]  Date of Birth: 1981-08-07  MRN: [id number]   Reason for the Medication: The patient, without the benefit of the specific treatment measure, is incapable of participating in any available treatment plan that will give the patient a realistic opportunity of improving the patient's condition.   Consideration of Side Effects: Consideration of the side effects related to the medication plan has been given.  Rationale for Medication Administration: Consultation for forced medication protocol was requested because the patient is not consistently taking his medication which has resulted in acute psychiatric decompensation. He has been irritable and paranoid. Patient has not been engaging in his treatment plans.  I met with this patient outside his room in attempt to verbally de-escalate. Patient was initially evaluated on the hallway, after patient had declined taking oral agitation meds. Patient was  irritable, agitated, shouting, pressured speech and disorganized in his thought process. As a result IM agitation medications were drawn and the patient was attempting to refuse meds.  At that time patient stated," I am not on drugs and I do not need any drugs to help me, my mom is the person with depression and schizophrenia, she is the reason why I am in here right now, I have a 45-year-old son who I need to take care of and I do not trust him with my parents".   I attempted to verbally de-escalate the patient and escorted the patient back to his room alongside 2 nurse techs. Patient was more cooperative, however he continued to be disorganized with his thought process, labile and irritability.  On interview the patient cannot explain his reason for admission, cannot articulate an understanding of his psychiatric diagnosis, and cannot discuss risks/benefits to  his medications.  He was too agitated  answer questions when asked about SI or HI and makes frequent statements that he believes the physicians are trying to control him.   The patient currently is too symptomatic to engage in meaningful conversations regarding the treatment of her psychiatric illness and is unlikely to improve without administration of forced medications. He is not able to participate in a meaningful way in decisions regarding medications or treatment. 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 It is my opinion that should the patient refuse medications that this would lead to more risk of potential self-harm than if he was on a protocol for medications against objection.   This documentation is good for (7) seven days from the date of the MD signature. New documentation must be completed every seven (7) days with detailed justification in the medical record if the patient requires continued non-emergent administration of psychotropic medications.

## 2024-02-20 NOTE — H&P (Cosign Needed)
 Psychiatric Admission Assessment Adult   Patient Identification: Victor [name] MRN:  [id number] Date of Evaluation:  02/20/2024   Chief Complaint:  Bipolar 1 disorder with moderate mania (HCC) [F31.12],  Bipolar 1 disorder with moderate mania (HCC)  Principal Problem:   Bipolar 1 disorder with moderate mania (HCC)     History of Present Illness:  Victor [name] is a 43 y.o., male with a past psychiatric history of bipolar disorder. He was placed under IVC by his mother for persecutory hallucinations and delusions about the government. He was evaluated at [facility] ED and admitted to the [facility] for symptomatic and medication management of manic and psychotic behavior.   Patient was initially evaluated on the hallway, after patient had declined taking oral agitation meds. Patient was  irritable, agitated, shouting, pressured speech and disorganized in his thought process. As a result IM agitation medications were drawn and the patient was attempting to refuse meds.  At that time patient stated," I am not on drugs and I do not need any drugs to help me, my mom is the person with depression and schizophrenia, she is the reason why I am in here right now, I have a 28-year-old son who I need to take care of and I do not trust him with my parents".   I attempted to verbally de-escalate the patient and escorted the patient back to his room alongside 2 nurse techs. Patient was more cooperative, however he continued to be disorganized with his thought process, labile and irritability.  Stating," this all started back in 1988 when I was improperly diagnosed with ADHD and started on Ritalin, years later ended up in the behavioral health Hospital in 2002 where I was diagnosed with bipolar disorder".   Patient reports noncompliance on psychiatric medications in the past and per report used to go to [facility] "I don't need to be on fucking meds".  Patient reports that," I do not  need drugs, my normal is not like everyone's normal, the medications always decreased my energy and may be more tired".  Patient continues to be agitated, combative, refusing request for IM agitation meds. After multiple attempts by various providers meds were administered.    Patient is a poor historian and was unable to give a linear story. On assessment, patient demonstrated the following manic symptoms of increased irritability    Chart review: On chart review, prior to this evaluation, patient was related [facility] for a psychiatric evaluation.  Patient was IVC by his mother for persecutory hallucinations and delusions about the government.  Per the IVC the patient has been refusing his medications, complaining of hearing voices and not been sleeping at all.Labs notable for hypokalemia of 3.4 and UDS positive for THC.   Subjective Sleep past 24 hours: poor  Subjective Appetite past 24 hours: unable to access     Information was collected with the assistance of the EMR    Past Psychiatric History:  Previous psych diagnoses: Bipolar disorder Prior inpatient psychiatric treatment: Patient reports prior admission to behavioral Hospital in [place], with [facility]: Behavioral health hospitalization was back in May 2002, unable to access EMR records.  Prior outpatient psychiatric treatment: [facility]  Current psychiatric provider: Denies   Neuromodulation history: not assessed and denies   Current therapist: Denies Psychotherapy hx: N/A   History of suicide attempts: unable to access History of homicide: unable to access   Psychotropic medications: Current None    Past Seroquel  Substance Use History: Alcohol: unable to access Hx withdrawal tremors/shakes: unable to access Hx alcohol related blackouts: unable to access Hx alcohol induced hallucinations: unable to access Hx alcoholic seizures: unable to access Hx medical hospitalization due to severe alcohol withdrawal  symptoms: unable to access DUI: unable to access   --------   Tobacco: unable to access Cannabis (marijuana): Yes, positive UDS  Cocaine: unable to access Methamphetamines: unable to access Psilocybin (mushrooms): unable to access Ecstasy (MDMA / molly): unable to access LSD (acid): unable to access Opiates (fentanyl / heroin): unable to access Benzos (Xanax, Klonopin): unable to access IV drug use: unable to access Prescribed meds abuse: unable to access   History of detox: unable to access History of rehab: unable to access   Is the patient at risk to self? Unknown Has the patient been a risk to self in the past 6 months? Unknown Has the patient been a risk to self within the distant past? Unknown Is the patient a risk to others? Yes Has the patient been a risk to others in the past 6 months? Yes Has the patient been a risk to others within the distant past? Yes   Alcohol Screening: 1. How often do you have a drink containing alcohol?: Never 2. How many drinks containing alcohol do you have on a typical day when you are drinking?: 1 or 2 3. How often do you have six or more drinks on one occasion?: Never AUDIT-C Score: 0 4. How often during the last year have you found that you were not able to stop drinking once you had started?: Never 5. How often during the last year have you failed to do what was normally expected from you because of drinking?: Never 6. How often during the last year have you needed a first drink in the morning to get yourself going after a heavy drinking session?: Never 7. How often during the last year have you had a feeling of guilt of remorse after drinking?: Never 8. How often during the last year have you been unable to remember what happened the night before because you had been drinking?: Never 9. Have you or someone else been injured as a result of your drinking?: No 10. Has a relative or friend or a doctor or another health worker been concerned  about your drinking or suggested you cut down?: No Alcohol Use Disorder Identification Test Final Score (AUDIT): 0 Tobacco Screening:     Substance Abuse History in the last 12 months: Unknown   Allergies: endorses the following medication allergies with resulting symptoms: Amoxicillin and Penicillin, nausea and vomting    Past Medical/Surgical History:  Medical Diagnoses:  Home Rx: None Prior Hosp: No admissions in EMR, multiple ED visits for acute visits  Prior Surgeries / non-head trauma: abdominal surgery per EMR    Head trauma: unable to access LOC: unable to access Concussions: unable to access Seizures: unable to access   Last menstrual period and contraceptives: N/A   Family History:  Medical: None  Psych: Patient reports depression and schizophrenia in mother, patient was Psych Rx: unable to access Suicide: unable to access Homicide: unable to access Substance use family hx: unable to access   Social History:  Place of birth and grew up where: unable to access Abuse: unable to access Marital Status: unable to access Sexual orientation: straight Children: 30 34.45 year old boy  Employment: unable to access Highest level of education: unable to access Housing: unable to access Finances: unable  to access Legal: unable to access Military: unable to access Weapons: unable to access Pills stockpile: unable to access   Lab Results:  Lab Results Last 48 Hours  No results found for this or any previous visit (from the past 48 hours).       Blood Alcohol level:  Recent Labs       Lab Results  Component Value Date    ETH <10 02/18/2024    ETH <10 11/25/2022        Metabolic Disorder Labs:  Recent Labs  No results found for: "HGBA1C", "MPG"   Recent Labs  No results found for: "PROLACTIN"   Recent Labs  No results found for: "CHOL", "TRIG", "HDL", "CHOLHDL", "VLDL", "LDLCALC"     Current Medications:          Current Facility-Administered Medications   Medication Dose Route Frequency Provider Last Rate Last Admin   acetaminophen (TYLENOL) tablet 650 mg  650 mg Oral Q6H PRN [name], NP     alum & mag hydroxide-simeth (MAALOX/MYLANTA) 200-200-20 MG/5ML suspension 30 mL  30 mL Oral Q6H PRN [name], NP     benztropine (COGENTIN) tablet 1 mg  1 mg Oral BID [name], MD     haloperidol (HALDOL) tablet 5 mg  5 mg Oral TID PRN [name], NP      And   diphenhydrAMINE (BENADRYL) capsule 50 mg  50 mg Oral TID PRN [name], NP     haloperidol lactate (HALDOL) injection 5 mg  5 mg Intramuscular TID PRN [name], NP      And   diphenhydrAMINE (BENADRYL) injection 50 mg  50 mg Intramuscular TID PRN [name], NP      And   LORazepam (ATIVAN) injection 2 mg  2 mg Intramuscular TID PRN [name], NP     haloperidol lactate (HALDOL) injection 10 mg  10 mg Intramuscular TID PRN [name], NP  10 mg at 02/20/24 0950    And   diphenhydrAMINE (BENADRYL) injection 50 mg  50 mg Intramuscular TID PRN [name], NP  50 mg at 02/20/24 4098    And   LORazepam (ATIVAN) injection 2 mg  2 mg Intramuscular TID PRN [name], NP  2 mg at 02/20/24 0950   haloperidol (HALDOL) tablet 5 mg  5 mg Oral BID [name], MD     hydrOXYzine (ATARAX) tablet 25 mg  25 mg Oral TID PRN [name], NP     ibuprofen (ADVIL) tablet 600 mg  600 mg Oral Q8H PRN [name], NP     magnesium hydroxide (MILK OF MAGNESIA) suspension 30 mL  30 mL Oral Daily PRN [name], NP     ondansetron (ZOFRAN) tablet 4 mg  4 mg Oral Q8H PRN [name], NP     zolpidem (AMBIEN) tablet 5 mg  5 mg Oral QHS PRN [name], NP            PTA Medications: No medications prior to admission.          Physical Findings: AIMS: No   CIWA:    COWS:      Psychiatric Specialty Exam: General Appearance: Appropriate for Environment    Eye Contact: Fleeting    Speech: Pressured     Volume: Increased    Mood: Angry; Anxious; Labile; Irritable    Affect: Congruent; Labile; Full Range    Thought Content: Illogical; Perseveration; Paranoid Ideation; Delusions; Scattered    Suicidal Thoughts: Suicidal Thoughts:  No    Homicidal Thoughts: Homicidal Thoughts: No    Thought Process: Disorganized    Orientation: Other (comment) (unable to access)      Memory: Recent Poor; Immediate Poor; Remote Poor    Judgment: Impaired    Insight: Lacking    Concentration: Poor    Recall: Poor    Fund of Knowledge: Poor    Language: Fair    Psychomotor Activity: Psychomotor Activity: Increased; Restlessness    Assets: Physical Health    Sleep: Sleep: -- (unable to access)      Review of Systems Review of Systems  Constitutional:  Negative for diaphoresis.  Respiratory:  Negative for cough.   Neurological:  Negative for seizures.  Psychiatric/Behavioral:  Negative for depression. The patient is nervous/anxious and has insomnia.       Vital signs: Blood pressure 129/79, pulse 61, temperature 97.6 F (36.4 C), temperature source Oral, resp. rate 20, height 5\' 7"  (1.702 m), weight 83 kg, SpO2 99%. Body mass index is 28.66 kg/m. Physical Exam Eyes:     Conjunctiva/sclera: Conjunctivae normal.  Pulmonary:     Effort: Pulmonary effort is normal.  Musculoskeletal:        General: Normal range of motion.  Neurological:     Mental Status: He is alert. He is disoriented.  Psychiatric:        Attention and Perception: He does not perceive auditory or visual hallucinations.        Mood and Affect: Mood is anxious and elated. Affect is labile and angry.        Speech: Speech is rapid and pressured.        Behavior: Behavior is uncooperative, agitated, aggressive and combative.        Thought Content: Thought content is paranoid and delusional.        Assets  Assets:Physical Health     Treatment Plan Summary: Daily contact with patient to assess and evaluate  symptoms and progress in treatment and medication management   ASSESSMENT: Victor [name] is a 43 y.o., male with a past psychiatric history of bipolar disorder. He was placed under IVC by his mother for persecutory hallucinations and delusions about the government. He was evaluated at [facility] ED and admitted to the [facility] for symptomatic and medication management of manic and psychotic behavior.     On intake assessment, patient is irritable, agitated, shouting, with pressured speech and perseverative that mother is the reason why he is currently manic.  Will uphold IVC given patient's acute mania and concern to be a danger to others given acuity.  Will consider contacting other who placed IVC on patient.  Patient refused medications earlier this morning and required IM agitation protocol to help calm, patient.  Given patient's acute mania/psychosis will start patient on Haldol given acuity and variable administrative options.  Discussed need for forced medications with attending psychiatrist on the unit and he is in agreement with forced meds if necessary.   Bipolar Disorder, Manic Episode, w/ psychotic features  Cannabis Use  PLAN: Safety and Monitoring:             -- Involuntary admission to inpatient psychiatric unit for safety, stabilization and treatment             -- Daily contact with patient to assess and evaluate symptoms and progress in treatment             -- Patient's case to be discussed in multi-disciplinary team  meeting             -- Observation Level : q15 minute checks             -- Vital signs: q12 hours             -- Precautions: suicide, elopement, and assault   2. Interventions (medications, psychoeducation, etc):               -- Start Haldol 5 mg twice daily for mania/ psychosis, patient previously refused medications throughout the day, Haldol allows for variable administrations IM, PO and possible consideration of LAI               -- 2nd opinion for need for forced medicaitons provided by attending psychiatrist Dr. Jimmey Ralph, refer to Plan of Care note from 02/20/24              -- Start Cogentin 1 mg twice daily for EPS ppx   -- Discontinued Zyprexa 5 mg at bedtime, patient refused medications    PRN medications for symptomatic management:              -- start Ambien 5 mg for insomnia as needed               -- start acetaminophen 650 mg every 6 hours as needed for mild to moderate pain, fever, and headaches              -- start hydroxyzine 25 mg three times a day as needed for anxiety              -- start aluminum-magnesium hydroxide + simethicone 30 mL every 4 hours as needed for heartburn             -- As needed agitation protocol in-place   The risks/benefits/side-effects/alternatives to the above medication were discussed in detail with the patient and time was given for questions. The patient consents to medication trial. FDA black box warnings, if present, were discussed.   The patient is agreeable with the medication plan, as above. We will monitor the patient's response to pharmacologic treatment, and adjust medications as necessary.   3. Routine and other pertinent labs: EKG monitoring: QTc: 425 Metabolism / endocrine: BMI: Body mass index is 28.66 kg/m. Prolactin: Recent Labs  No results found for: "PROLACTIN"   Lipid Panel: Recent Labs  No results found for: "CHOL", "TRIG", "HDL", "CHOLHDL", "VLDL", "LDLCALC"   QMVH8I: Last Labs  No results found for: "HGBA1C"   TSH: Last Labs  No results found for: "TSH"     Drugs of Abuse  Labs (Brief)          Component Value Date/Time    LABOPIA NONE DETECTED 02/18/2024 1735    COCAINSCRNUR NONE DETECTED 02/18/2024 1735    LABBENZ NONE DETECTED 02/18/2024 1735    AMPHETMU NONE DETECTED 02/18/2024 1735    THCU POSITIVE (A) 02/18/2024 1735    LABBARB NONE DETECTED 02/18/2024 1735        4. Group Therapy:             -- Encouraged patient  to participate in unit milieu and in scheduled group therapies              -- Short Term Goals: Ability to identify changes in lifestyle to reduce recurrence of condition, verbalize feelings, identify and develop effective coping behaviors, maintain clinical measurements within normal limits, and identify triggers associated with substance abuse/mental health issues will improve.  Improvement in ability to demonstrate self-control and comply with prescribed medications.             -- Long Term Goals: Improvement in symptoms so as ready for discharge -- Patient is encouraged to participate in group therapy while admitted to the psychiatric unit. -- We will address other chronic and acute stressors, which contributed to the patient's Bipolar 1 disorder with moderate mania (HCC) in order to reduce the risk of self-harm at discharge.   5. Discharge Planning:              -- Social work and case management to assist with discharge planning and identification of hospital follow-up needs prior to discharge             -- Estimated LOS: 5-7  days             -- Discharge Concerns: Need to establish a safety plan; Medication compliance and effectiveness             -- Discharge Goals: Return home with outpatient referrals for mental health follow-up including medication management/psychotherapy   I certify that inpatient services furnished can reasonably be expected to improve the patient's condition.     Signed: [name], MD 02/20/2024, 8:47 PM

## 2024-02-20 NOTE — Plan of Care (Signed)
   Problem: Education: Goal: Emotional status will improve Outcome: Not Progressing Goal: Mental status will improve Outcome: Not Progressing   Problem: Activity: Goal: Interest or engagement in activities will improve Outcome: Not Progressing

## 2024-02-20 NOTE — Progress Notes (Signed)
 Dar Note: Patient presents irritable, hyper verbal, loud and intrusive in milieu.  Refused to take medication when offered.  Stated, "I'm not going to take any medications." Patient became confrontational and argumentative with peer when told to keep his voice down.  Patient continues with verbal threat towards staff when redirected.  Patient told staff to go "F" themselves.  PRN Ativan 2 mg, Haldol 10 mg and Benadryl 50 mg IM given for severe agitation.  Routine safety checks maintained.  Patient is safe on the unit

## 2024-02-21 ENCOUNTER — Encounter (HOSPITAL_COMMUNITY): Payer: Self-pay

## 2024-02-21 DIAGNOSIS — F3112 Bipolar disorder, current episode manic without psychotic features, moderate: Secondary | ICD-10-CM | POA: Diagnosis not present

## 2024-02-21 MED ORDER — HALOPERIDOL 5 MG PO TABS
5.0000 mg | ORAL_TABLET | Freq: Two times a day (BID) | ORAL | Status: DC
  Administered 2024-02-21 – 2024-02-24 (×6): 5 mg via ORAL
  Filled 2024-02-21 (×11): qty 1

## 2024-02-21 MED ORDER — HALOPERIDOL 5 MG PO TABS
5.0000 mg | ORAL_TABLET | Freq: Two times a day (BID) | ORAL | Status: DC
  Filled 2024-02-21 (×2): qty 1

## 2024-02-21 MED ORDER — BENZTROPINE MESYLATE 1 MG/ML IJ SOLN
1.0000 mg | Freq: Two times a day (BID) | INTRAMUSCULAR | Status: DC
  Filled 2024-02-21 (×6): qty 1

## 2024-02-21 MED ORDER — HALOPERIDOL LACTATE 5 MG/ML IJ SOLN
5.0000 mg | Freq: Two times a day (BID) | INTRAMUSCULAR | Status: DC
  Filled 2024-02-21 (×8): qty 1

## 2024-02-21 MED ORDER — BENZTROPINE MESYLATE 1 MG PO TABS
1.0000 mg | ORAL_TABLET | Freq: Two times a day (BID) | ORAL | Status: DC
  Administered 2024-02-21 – 2024-02-23 (×4): 1 mg via ORAL
  Filled 2024-02-21 (×6): qty 1

## 2024-02-21 MED ORDER — HALOPERIDOL LACTATE 5 MG/ML IJ SOLN
5.0000 mg | Freq: Two times a day (BID) | INTRAMUSCULAR | Status: DC
  Filled 2024-02-21 (×2): qty 1

## 2024-02-21 MED ORDER — DIVALPROEX SODIUM 500 MG PO DR TAB
500.0000 mg | DELAYED_RELEASE_TABLET | Freq: Two times a day (BID) | ORAL | Status: DC
  Administered 2024-02-21 – 2024-02-24 (×7): 500 mg via ORAL
  Filled 2024-02-21 (×14): qty 1

## 2024-02-21 NOTE — Progress Notes (Signed)
 Recreation Therapy Notes  INPATIENT RECREATION THERAPY ASSESSMENT  Patient Details Name: Victor [name] MRN: [id number] DOB: [birth year] Today's Date: 02/21/2024       Information Obtained From: Chart Review (Patient as well)  Able to Participate in Assessment/Interview: Yes (Pt was able to complete assessment but was agitated and only answered one question. Pt then went on a tangent about not wanting to be here, wanting to be with his son and that his mother is the reason he is here.)  Patient Presentation: Alert (Agitated)  Reason for Admission (Per Patient): Other (Comments) (per chart: restlessness, anger, anxiety, agitation; pt stated something his mother said is why he is here)  Patient Stressors: Other (Comment) (per chart: finances)  Coping Skills:    Industrial/product designer)  Leisure Interests (2+):  Social - Family (per patient: watch cartoons, play outside and go riding with son)  Frequency of Recreation/Participation: Other (Comment) (Daily)  Awareness of Community Resources:   (UTA)  [place] of Residence:  per chart: [place]  Patient Main Form of Transportation:  (UTA)  Patient Strengths:  per chart: his son  Patient Identified Areas of Improvement:  UTA  Patient Goal for Hospitalization:  UTA  Current SI (including self-harm):   (UTA)  Current HI:   (UTA)  Current AVH:  (UTA)  Staff Intervention Plan: Group Attendance, Collaborate with Interdisciplinary Treatment Team  Consent to Intern Participation: N/A   Graysin Luczynski-McCall, LRT,CTRS Jerrilynn Mikowski A Calina Patrie-McCall 02/21/2024, 1:23 PM

## 2024-02-21 NOTE — BH IP Treatment Plan (Addendum)
 Interdisciplinary Treatment and Diagnostic Plan Update  02/21/2024 Time of Session: 1020am Victor [name] MRN: [id number]  Principal Diagnosis: Bipolar 1 disorder with moderate mania (HCC)  Secondary Diagnoses: Principal Problem:   Bipolar 1 disorder with moderate mania (HCC)   Current Medications:  Current Facility-Administered Medications  Medication Dose Route Frequency Provider Last Rate Last Admin   acetaminophen (TYLENOL) tablet 650 mg  650 mg Oral Q6H PRN [name], NP       alum & mag hydroxide-simeth (MAALOX/MYLANTA) 200-200-20 MG/5ML suspension 30 mL  30 mL Oral Q6H PRN [name], NP       benztropine (COGENTIN) tablet 1 mg  1 mg Oral BID [name], MD   1 mg at 02/21/24 1210   Or   benztropine mesylate (COGENTIN) injection 1 mg  1 mg Intramuscular BID [name], MD       haloperidol (HALDOL) tablet 5 mg  5 mg Oral TID PRN [name], NP       And   diphenhydrAMINE (BENADRYL) capsule 50 mg  50 mg Oral TID PRN [name], NP       haloperidol lactate (HALDOL) injection 5 mg  5 mg Intramuscular TID PRN [name], NP       And   diphenhydrAMINE (BENADRYL) injection 50 mg  50 mg Intramuscular TID PRN [name], NP       And   LORazepam (ATIVAN) injection 2 mg  2 mg Intramuscular TID PRN [name], NP       haloperidol lactate (HALDOL) injection 10 mg  10 mg Intramuscular TID PRN [name], NP   10 mg at 02/20/24 0950   And   diphenhydrAMINE (BENADRYL) injection 50 mg  50 mg Intramuscular TID PRN [name], NP   50 mg at 02/20/24 0950   And   LORazepam (ATIVAN) injection 2 mg  2 mg Intramuscular TID PRN [name], NP   2 mg at 02/20/24 0950   divalproex (DEPAKOTE) DR tablet 500 mg  500 mg Oral Q12H [name], MD   500 mg at 02/21/24 1211   haloperidol (HALDOL) tablet 5 mg  5 mg Oral BID [name], MD   5 mg at 02/21/24 1210   Or   haloperidol lactate (HALDOL) injection 5 mg  5 mg  Intramuscular BID [name], MD       hydrOXYzine (ATARAX) tablet 25 mg  25 mg Oral TID PRN [name], NP       ibuprofen (ADVIL) tablet 600 mg  600 mg Oral Q8H PRN [name], NP       magnesium hydroxide (MILK OF MAGNESIA) suspension 30 mL  30 mL Oral Daily PRN [name], NP       ondansetron (ZOFRAN) tablet 4 mg  4 mg Oral Q8H PRN [name], NP       zolpidem (AMBIEN) tablet 5 mg  5 mg Oral QHS PRN [name], NP       PTA Medications: No medications prior to admission.    Patient Stressors: Financial difficulties    Patient Strengths: Active sense of humor  Supportive family/friends   Treatment Modalities: Medication Management, Group therapy, Case management,  1 to 1 session with clinician, Psychoeducation, Recreational therapy.   Physician Treatment Plan for Primary Diagnosis: Bipolar 1 disorder with moderate mania (HCC) Long Term Goal(s):     Short Term Goals:    Medication Management: Evaluate patient's response, side effects, and tolerance of medication regimen.  Therapeutic Interventions: 1 to  1 sessions, Unit Group sessions and Medication administration.  Evaluation of Outcomes: Not Progressing  Physician Treatment Plan for Secondary Diagnosis: Principal Problem:   Bipolar 1 disorder with moderate mania (HCC)  Long Term Goal(s):     Short Term Goals:       Medication Management: Evaluate patient's response, side effects, and tolerance of medication regimen.  Therapeutic Interventions: 1 to 1 sessions, Unit Group sessions and Medication administration.  Evaluation of Outcomes: Not Progressing   RN Treatment Plan for Primary Diagnosis: Bipolar 1 disorder with moderate mania (HCC) Long Term Goal(s): Knowledge of disease and therapeutic regimen to maintain health will improve  Short Term Goals: Ability to remain free from injury will improve, Ability to verbalize frustration and anger appropriately will improve, Ability to  demonstrate self-control, Ability to participate in decision making will improve, Ability to verbalize feelings will improve, Ability to disclose and discuss suicidal ideas, Ability to identify and develop effective coping behaviors will improve, and Compliance with prescribed medications will improve  Medication Management: RN will administer medications as ordered by provider, will assess and evaluate patient's response and provide education to patient for prescribed medication. RN will report any adverse and/or side effects to prescribing provider.  Therapeutic Interventions: 1 on 1 counseling sessions, Psychoeducation, Medication administration, Evaluate responses to treatment, Monitor vital signs and CBGs as ordered, Perform/monitor CIWA, COWS, AIMS and Fall Risk screenings as ordered, Perform wound care treatments as ordered.  Evaluation of Outcomes: Not Progressing   LCSW Treatment Plan for Primary Diagnosis: Bipolar 1 disorder with moderate mania (HCC) Long Term Goal(s): Safe transition to appropriate next level of care at discharge, Engage patient in therapeutic group addressing interpersonal concerns.  Short Term Goals: Engage patient in aftercare planning with referrals and resources, Increase social support, Increase ability to appropriately verbalize feelings, Increase emotional regulation, Facilitate acceptance of mental health diagnosis and concerns, Facilitate patient progression through stages of change regarding substance use diagnoses and concerns, Identify triggers associated with mental health/substance abuse issues, and Increase skills for wellness and recovery  Therapeutic Interventions: Assess for all discharge needs, 1 to 1 time with Social worker, Explore available resources and support systems, Assess for adequacy in community support network, Educate family and significant other(s) on suicide prevention, Complete Psychosocial Assessment, Interpersonal group  therapy.  Evaluation of Outcomes: Not Progressing   Progress in Treatment: Attending groups: No. Participating in groups: No. Taking medication as prescribed: Yes. Toleration medication: Yes. Family/Significant other contact made: No, will contact:  Weyman Croon (father) [phone] and Darel Hong (mother) [phone] Patient understands diagnosis: No. Discussing patient identified problems/goals with staff: Yes. Medical problems stabilized or resolved: Yes. Denies suicidal/homicidal ideation: Yes. Issues/concerns per patient self-inventory: No.  New problem(s) identified: No, Describe:  none  New Short Term/Long Term Goal(s): medication stabilization, elimination of SI thoughts, development of comprehensive mental wellness plan.    Patient Goals:  Pt psychotic, unable to state treatment goals  Discharge Plan or Barriers: Patient recently admitted. CSW will continue to follow and assess for appropriate referrals and possible discharge planning.    Reason for Continuation of Hospitalization: Mania Medication stabilization  Estimated Length of Stay: 5-7 days  Last 3 [place] Suicide Severity Risk Score: Flowsheet Row Admission (Current) [facility] from 02/18/2024 in [facility] at [facility] ED from 08/09/2023 in [facility] Emergency Department at [facility]  C-SSRS RISK CATEGORY No Risk No Risk No Risk  Last PHQ 2/9 Scores:     No data to display          Scribe for Treatment Team: Kathi Der, Theresia Majors 02/21/2024 12:55 PM

## 2024-02-21 NOTE — Progress Notes (Signed)
   02/21/24 1100  Psych Admission Type (Psych Patients Only)  Admission Status Involuntary  Psychosocial Assessment  Patient Complaints Hyperactivity;Irritability  Eye Contact Intense  Facial Expression Angry;Animated  Affect Apprehensive;Labile  Speech Aggressive  Interaction Defensive  Motor Activity Fidgety  Appearance/Hygiene Unremarkable  Behavior Characteristics Unwilling to participate  Mood Labile  Thought Process  Coherency Concrete thinking  Content Blaming others  Delusions None reported or observed  Perception WDL  Hallucination None reported or observed  Judgment Poor  Confusion None  Danger to Self  Current suicidal ideation? Denies  Danger to Others  Danger to Others None reported or observed   Dar Note: Patient presents irritable and angry.  Demanded for discharge and refused to take his medications.  Patient reminded about forced medication order and reluctantly took his medication orally after show of support.  Patient remained in his room for majority of this shift.  Routine safety checks maintained.  Patient is safe on the unit.

## 2024-02-21 NOTE — Progress Notes (Signed)
   02/21/24 2200  Psych Admission Type (Psych Patients Only)  Admission Status Involuntary  Psychosocial Assessment  Patient Complaints Agitation;Anxiety  Eye Contact Fair  Facial Expression Angry  Affect Apprehensive;Fearful  Speech Logical/coherent  Interaction Defensive  Motor Activity Fidgety  Appearance/Hygiene Unremarkable  Behavior Characteristics Agitated;Anxious  Mood Angry;Apprehensive  Thought Process  Coherency WDL  Content Blaming others  Delusions None reported or observed  Perception WDL  Hallucination None reported or observed  Judgment Poor  Danger to Self  Current suicidal ideation? Denies  Danger to Others  Danger to Others None reported or observed

## 2024-02-21 NOTE — Group Note (Signed)
 Recreation Therapy Group Note   Group Topic:Stress Management  Group Date: 02/21/2024 Start Time: 1009 End Time: 1029 Facilitators: Abdiel Blackerby-McCall, LRT,CTRS Location: 500 Hall Dayroom   Group Topic: Stress Management  Goal Area(s) Addresses:  Patient will identify positive stress management techniques. Patient will identify benefits of using stress management post d/c.  Intervention: Calm App  Activity:  Meditation. LRT and patients went through a series of stretches to loosen and relax the muscles. LRT then played a meditation that focused on being resilient in the face of adversity. Patients were encouraged to sit back, relax and focus on their breathing to give full attention to the meditation as it played.   Education:  Stress Management, Discharge Planning.   Education Outcome: Acknowledges Education   Affect/Mood: N/A   Participation Level: Did not attend    Clinical Observations/Individualized Feedback:     Plan: Continue to engage patient in RT group sessions 2-3x/week.   Victor [name], LRT,CTRS 02/21/2024 12:30 PM

## 2024-02-21 NOTE — [facility] Group Notes (Signed)
 Adult Psychoeducational Group Note  Date:  02/21/2024 Time:  6:50 PM  Group Topic/Focus:  Goals Group:   The focus of this group is to help patients establish daily goals to achieve during treatment and discuss how the patient can incorporate goal setting into their daily lives to aide in recovery. Orientation:   The focus of this group is to educate the patient on the purpose and policies of crisis stabilization and provide a format to answer questions about their admission.  The group details unit policies and expectations of patients while admitted.  Participation Level:  Did Not Attend  Participation Quality:    Affect:    Cognitive:    Insight:   Engagement in Group:    Modes of Intervention:    Additional Comments:    Sheran Lawless 02/21/2024, 6:50 PM

## 2024-02-21 NOTE — Plan of Care (Signed)
   Problem: Education: Goal: Knowledge of [facility] Education information/materials will improve Outcome: Progressing Goal: Emotional status will improve Outcome: Progressing Goal: Mental status will improve Outcome: Progressing Goal: Verbalization of understanding the information provided will improve Outcome: Progressing

## 2024-02-21 NOTE — Plan of Care (Signed)
   Problem: Education: Goal: Emotional status will improve Outcome: Not Progressing Goal: Mental status will improve Outcome: Not Progressing

## 2024-02-21 NOTE — Progress Notes (Signed)
 [facility] MD Progress Note  02/21/2024 4:45 PM Victor [name]  MRN:  [id number]  Principal Problem: Bipolar 1 disorder with moderate mania (HCC) Diagnosis: Principal Problem:   Bipolar 1 disorder with moderate mania (HCC)  Reason for Admission:  Victor [name] is a 43 y.o., male with a past psychiatric history of bipolar disorder. He was placed under IVC by his mother for persecutory hallucinations and delusions about the government. He was evaluated at [facility] ED and admitted to the [facility] for symptomatic and medication management of manic and psychotic behavior.  (admitted on 02/19/2024, total  LOS: 2 days )  Chart Review from last 24 hours:  The patient's chart was reviewed and nursing notes were reviewed. The patient's case was discussed in multidisciplinary team meeting.   - Overnight events to report per chart review / staff report:  continued to refuse medications and was cussing in the day room, advised to go into room and went  - No agitation medications given overnight  Information Obtained Today During Patient Interview: The patient was seen and evaluated on the unit. On assessment today, patient i is in his room, irritable but cooperative during interview answering questioning.  Continues to note frustrations with being here in hospitalization and blames mother.  Reports that he does not need to be here and that he does not need to be on medicines.  Patient reported he does not want to take medicine. "My normal, may be different from your normal, the Seroquel just brings my energy down".   Past Psychiatric History:  Previous psych diagnoses: Bipolar disorder Prior inpatient psychiatric treatment: Patient reports prior admission to behavioral [facility] in [place], with [facility]: Behavioral health hospitalization was back in May 2002, unable to access EMR records.  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 Hx medical hospitalization due to severe alcohol withdrawal symptoms: unable to access DUI: unable to access   --------   Tobacco: unable to access Cannabis (marijuana): Yes, positive UDS  Cocaine: unable to access Methamphetamines: unable to access Psilocybin (mushrooms): unable to access Ecstasy (MDMA / molly): unable to access LSD (acid): unable to access Opiates (fentanyl / heroin): unable to access Benzos (Xanax, Klonopin): unable to access IV drug use: unable to access Prescribed meds abuse: unable to access   History of detox: unable to access History of rehab: unable to access  Past Medical History:  Past Medical History:  Diagnosis Date   Medical history non-contributory     Family Psychiatric History:  Social History:  Place of birth and grew up where: Lives in [place]  Abuse: unable to access Marital Status: unable to access Sexual orientation: straight Children: 73 43.45 year old boy  Employment: Unemployed, quit job as Optometrist level of education: unable to access Housing: Lives with his son  Finances: Unemployed  Legal: No ongoing Building surveyor: Denies  Weapons: unable to access Pills stockpile: unable to access Current Medications: Current Facility-Administered Medications  Medication Dose Route Frequency Provider  Last Rate Last Admin   acetaminophen (TYLENOL) tablet 650 mg  650 mg Oral Q6H PRN [name], NP       alum & mag  hydroxide-simeth (MAALOX/MYLANTA) 200-200-20 MG/5ML suspension 30 mL  30 mL Oral Q6H PRN [name], NP       benztropine (COGENTIN) tablet 1 mg  1 mg Oral BID [name], MD   1 mg at 02/21/24 1210   Or   benztropine mesylate (COGENTIN) injection 1 mg  1 mg Intramuscular BID [name], MD       haloperidol (HALDOL) tablet 5 mg  5 mg Oral TID PRN [name], NP       And   diphenhydrAMINE (BENADRYL) capsule 50 mg  50 mg Oral TID PRN [name], NP       haloperidol lactate (HALDOL) injection 5 mg  5 mg Intramuscular TID PRN [name], NP       And   diphenhydrAMINE (BENADRYL) injection 50 mg  50 mg Intramuscular TID PRN [name], NP       And   LORazepam (ATIVAN) injection 2 mg  2 mg Intramuscular TID PRN [name], NP       haloperidol lactate (HALDOL) injection 10 mg  10 mg Intramuscular TID PRN [name], NP   10 mg at 02/20/24 0950   And   diphenhydrAMINE (BENADRYL) injection 50 mg  50 mg Intramuscular TID PRN [name], NP   50 mg at 02/20/24 2440   And   LORazepam (ATIVAN) injection 2 mg  2 mg Intramuscular TID PRN [name], NP   2 mg at 02/20/24 0950   divalproex (DEPAKOTE) DR tablet 500 mg  500 mg Oral Q12H [name], MD   500 mg at 02/21/24 1211   haloperidol (HALDOL) tablet 5 mg  5 mg Oral BID [name], MD   5 mg at 02/21/24 1210   Or   haloperidol lactate (HALDOL) injection 5 mg  5 mg Intramuscular BID [name], MD       hydrOXYzine (ATARAX) tablet 25 mg  25 mg Oral TID PRN [name], NP       ibuprofen (ADVIL) tablet 600 mg  600 mg Oral Q8H PRN [name], NP       magnesium hydroxide (MILK OF MAGNESIA) suspension 30 mL  30 mL Oral Daily PRN [name], NP       ondansetron (ZOFRAN) tablet 4 mg  4 mg Oral Q8H PRN [name], NP       zolpidem (AMBIEN) tablet 5 mg  5 mg Oral QHS PRN [name], NP        Lab Results: No results found for this or any  previous visit (from the past 48 hours).  Blood Alcohol level:  Lab Results  Component Value Date   ETH <10 02/18/2024   ETH <10 11/25/2022    Metabolic Labs: No results found for: "HGBA1C", "MPG" No results found for: "PROLACTIN" No results found for: "CHOL", "TRIG", "HDL", "CHOLHDL", "VLDL", "LDLCALC"  Physical Findings: AIMS: No  CIWA:    COWS:     Psychiatric Specialty Exam: General Appearance: Appropriate for Environment; Casual   Eye Contact: Fair   Speech: Pressured   Volume: -- (Intermittently expansive, when getting frustrated)   Mood: Irritable   Affect: Congruent   Thought Content: Perseveration; Paranoid Ideation (continues to blame other for his IVC)   Suicidal Thoughts: Suicidal Thoughts: No   Homicidal Thoughts: Homicidal Thoughts: No   Thought  Process: Disorganized   Orientation: Full (Time, Place and Person)     Memory: Remote Fair; Recent Poor; Immediate Fair   Judgment: Poor   Insight: Poor   Concentration: Fair   Recall: Poor   Fund of Knowledge: Fair   Language: Fair   Psychomotor Activity: Psychomotor Activity: Normal   Assets: Physical Health   Sleep: Sleep: Fair Number of Hours of Sleep: 7.75    Review of Systems Review of Systems  Constitutional:  Negative for chills and fever.  Respiratory:  Negative for cough.   Cardiovascular:  Negative for chest pain.  Gastrointestinal:  Negative for nausea and vomiting.  Neurological:  Negative for headaches.  Psychiatric/Behavioral:  Negative for depression, hallucinations, substance abuse and suicidal ideas. The patient is nervous/anxious.     Vital Signs: Blood pressure 129/79, pulse 61, temperature 97.6 F (36.4 C), temperature source Oral, resp. rate 20, height 5\' 7"  (1.702 m), weight 83 kg, SpO2 99%. Body mass index is 28.66 kg/m. Physical Exam Eyes:     Conjunctiva/sclera: Conjunctivae normal.  Musculoskeletal:        General: Normal range of motion.   Neurological:     Mental Status: He is oriented to person, place, and time.  Psychiatric:        Attention and Perception: He does not perceive auditory or visual hallucinations.        Mood and Affect: Mood is elated. Affect is angry.        Speech: Speech is rapid and pressured.        Behavior: Behavior is agitated.        Thought Content: Thought content is paranoid. Thought content does not include homicidal or suicidal ideation. Thought content does not include homicidal or suicidal plan.    Assets  Assets: Physical Health  Treatment Plan Summary: Daily contact with patient to assess and evaluate symptoms and progress in treatment and Medication management  Diagnoses / Active Problems: Bipolar 1 disorder with moderate mania (HCC) Principal Problem:   Bipolar 1 disorder with moderate mania (HCC)   ASSESSMENT: Victor [name] is a 43 y.o., male with a past psychiatric history of bipolar disorder. He was placed under IVC by his mother for persecutory hallucinations and delusions about the government. He was evaluated at [facility] ED and admitted to the [facility] for symptomatic and medication management of manic and psychotic behavior.     On intake assessment, patient continues to be frustrated and irritability mildly improved. Has pressured speech and perseverative that mother is the reason why he is currently admitted. IVC upheld,given patient's acute mania and concern to be a danger to others given acuity. Patient refused medications earlier this morning. Forced medication order in place. Will contact mother for additional collateral on reasoning she placed placed IVC on patient.     Bipolar Disorder, Manic Episode, w/ psychotic features  Cannabis Use  PLAN: Safety and Monitoring:             -- Involuntary admission to inpatient psychiatric unit for safety, stabilization and treatment             -- Daily contact with patient to assess and  evaluate symptoms and progress in treatment             -- Patient's case to be discussed in multi-disciplinary team meeting             -- Observation Level : q15 minute checks             --  Vital signs: q12 hours             -- Precautions: suicide, elopement, and assault   2. Interventions (medications, psychoeducation, etc):              -- Continue Haldol 5 mg twice daily PO or IM for mania/ psychosis on of LAI              -- Forced medications, refer to progress note note from 02/20/24 from myself and Dr. Jimmey Ralph               -- Continue Cogentin 1 mg twice daily PO or IM for EPS ppx    PRN medications for symptomatic management:              -- continue Ambien 5 mg for insomnia as needed               -- continue acetaminophen 650 mg every 6 hours as needed for mild to moderate pain, fever, and headaches              -- continue hydroxyzine 25 mg three times a day as needed for anxiety              -- continue aluminum-magnesium hydroxide + simethicone 30 mL every 4 hours as needed for heartburn             -- As needed agitation protocol in-place   The risks/benefits/side-effects/alternatives to the above medication were discussed in detail with the patient and time was given for questions. The patient consents to medication trial. FDA black box warnings, if present, were discussed.   The patient is not agreeable with the medication plan, as above. We will monitor the patient's response to pharmacologic treatment, and adjust medications as necessary.  3. Routine and other pertinent labs:             -- Metabolic profile:  BMI: Body mass index is 28.66 kg/m.  Prolactin: No results found for: "PROLACTIN"  Lipid Panel: No results found for: "CHOL", "TRIG", "HDL", "CHOLHDL", "VLDL", "LDLCALC"  HbgA1c: No results found for: "HGBA1C"  TSH: No results found for: "TSH"  EKG monitoring: QTc: 425  4. Group Therapy:   -- Encouraged patient to participate in unit milieu and in  scheduled group therapies              -- Short Term Goals: Ability to identify changes in lifestyle to reduce recurrence of condition, verbalize feelings, identify and develop effective coping behaviors, maintain clinical measurements within normal limits, and identify triggers associated with substance abuse/mental health issues will improve. Improvement in ability to demonstrate self-control and comply with prescribed medications.             -- Long Term Goals: Improvement in symptoms so as ready for discharge -- Patient is encouraged to participate in group therapy while admitted to the psychiatric unit. -- We will address other chronic and acute stressors, which contributed to the patient's Bipolar 1 disorder with moderate mania (HCC) in order to reduce the risk of self-harm at discharge.   5. Discharge Planning:              -- Social work and case management to assist with discharge planning and identification of hospital follow-up needs prior to discharge             -- Estimated LOS: 4-6  days             --  Discharge Concerns: Need to establish a safety plan; Medication compliance and effectiveness             -- Discharge Goals: Return home with outpatient referrals for mental health follow-up including medication management/psychotherapy  I certify that inpatient services furnished can reasonably be expected to improve the patient's condition.   Signed: [name], MD 02/21/2024, 4:45 PM

## 2024-02-21 NOTE — Progress Notes (Signed)
 Patient stayed isolated to his room. Patient did not attend wrap up group. He was very angry and agitated in his room stating, "My mother belongs here, not me." Patient refused all meds this evening telling this nurse that he was "not going to allow [place] to put drugs in his system and that this writer should take them". Patient encouraged to reach out to staff if any needs or concerns are present. Denies SI/HI,and AVH. Safety checks maintained at Q 15 minutes intervals without incident. Support, encouragement and reassurance offered.

## 2024-02-21 NOTE — Plan of Care (Signed)
   Problem: Education: Goal: Emotional status will improve Outcome: Not Progressing Goal: Mental status will improve Outcome: Not Progressing   Problem: Activity: Goal: Interest or engagement in activities will improve Outcome: Not Progressing

## 2024-02-21 NOTE — Progress Notes (Signed)
 Patient refused vitals this morning, stating that "there is nothing wrong with me, so I'm not taking vitals".

## 2024-02-22 DIAGNOSIS — F3112 Bipolar disorder, current episode manic without psychotic features, moderate: Secondary | ICD-10-CM | POA: Diagnosis not present

## 2024-02-22 NOTE — Plan of Care (Signed)
   Problem: Education: Goal: Emotional status will improve Outcome: Progressing   Problem: Activity: Goal: Interest or engagement in activities will improve Outcome: Progressing

## 2024-02-22 NOTE — Progress Notes (Signed)
   02/22/24 1000  Psych Admission Type (Psych Patients Only)  Admission Status Involuntary  Psychosocial Assessment  Patient Complaints Anxiety  Eye Contact Fair  Facial Expression Animated  Affect Apprehensive  Speech Logical/coherent  Interaction Defensive  Motor Activity Fidgety  Appearance/Hygiene Unremarkable  Behavior Characteristics Appropriate to situation  Mood Apprehensive  Thought Process  Coherency WDL  Content Blaming others  Delusions None reported or observed  Perception WDL  Hallucination None reported or observed  Judgment Impaired  Confusion None  Danger to Self  Current suicidal ideation? Denies  Danger to Others  Danger to Others None reported or observed   Dar Note: Patient presents calm and appropriate on the unit.  Denies suicidal thoughts, auditory and visual hallucinations.  Medications given as prescribed.  Attended group and participated.  Patient interacted well with staff and peers.  Routine safety checks maintained.  Patient is safe on and off the unit.

## 2024-02-22 NOTE — Plan of Care (Signed)
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
## 2024-02-22 NOTE — Progress Notes (Signed)
 Patient gave consent for his father Rocket Gunderson) to speak with the healthcare team. His father called and would like his provider and social worker to give him a call regarding Efton's son. Contact number is [phone] (Cell) and [phone] (Home).
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
## 2024-02-22 NOTE — Group Note (Signed)
 Recreation Therapy Group Note   Group Topic:Leisure Education  Group Date: 02/22/2024 Start Time: 1010 End Time: 1040 Facilitators: Gerlad Pelzel-McCall, LRT,CTRS Location: [address] Dayroom   Group Topic: Leisure Education  Goal Area(s) Addresses:  Patient will identify positive leisure activities for use post discharge. Patient will identify at least one positive benefit of participation in leisure activities.  Patient will work effectively work with peer by sharing ideas and contributing to Social worker.  Intervention: Team Building, Group Presentation   Activity: Patients were placed in a circle and given a beach ball. Patients were to hit the ball to each like playing volleyball. The ball was to stay moving at all times. The ball could roll or bounce on the floor as long as it didn't stop moving. In the process, LRT timed the patients to see how long they could keep the ball in motion. If at any point the ball stopped, the timer would start over.  Education:  Leisure Scientist, physiological, Special educational needs teacher, Teamwork, Discharge Planning  Education Outcome: Acknowledges education/In group clarification offered/Needs additional education.    Affect/Mood: N/A   Participation Level: Did not attend    Clinical Observations/Individualized Feedback:      Plan: Continue to engage patient in RT group sessions 2-3x/week.   Amine Adelson-McCall, LRT,CTRS 02/22/2024 12:00 PM

## 2024-02-22 NOTE — Progress Notes (Signed)
 Patient A/O x 4, ambulatory without assistance. He denies SI/HI/AVH/Pain. He received his PO HS Medications without incident. See eMAR. He presented with agitation, anxiety and frustration. Patient states he does not need to take medications nor be in the hospital. He states this is all his mothers doing. Patient states he "is fine".  He states he does not like the way the medications make him feel. Patient states he feels drowsy and unable to think clearly when taking the medications. He states he just want to go home to take care of his son who means everything to him. Active listening/support/education provided to patient.

## 2024-02-22 NOTE — Progress Notes (Signed)
   02/22/24 2200  Psych Admission Type (Psych Patients Only)  Admission Status Involuntary  Psychosocial Assessment  Patient Complaints Anxiety  Eye Contact Fair  Facial Expression Anxious  Affect Appropriate to circumstance  Speech Logical/coherent  Interaction Assertive  Motor Activity Fidgety  Appearance/Hygiene Unremarkable  Behavior Characteristics Appropriate to situation  Mood Pleasant  Thought Process  Coherency WDL  Content Blaming others  Delusions None reported or observed  Perception WDL  Hallucination None reported or observed  Judgment Poor  Danger to Self  Current suicidal ideation? Denies  Danger to Others  Danger to Others None reported or observed

## 2024-02-22 NOTE — Group Note (Signed)
 Date:  02/22/2024 Time:  8:49 PM  Group Topic/Focus:  Wrap-Up Group:   The focus of this group is to help patients review their daily goal of treatment and discuss progress on daily workbooks.    Participation Level:  Active  Participation Quality:  Appropriate and Attentive  Affect:  Appropriate  Cognitive:  Alert and Appropriate  Insight: Appropriate and Good  Engagement in Group:  Engaged  Modes of Intervention:  Discussion and Education  Additional Comments:  Pt attended and participated in wrap up group this evening and rated their day a 5-6/10. Pt stated that they are ready for D/C in anticipation of seeing their son. Pt initially reported that they did not need any assistance from [facility], but later states that they would like to receive resources for housing inquiries.   Chrisandra Netters 02/22/2024, 8:49 PM

## 2024-02-22 NOTE — [facility] Group Notes (Signed)
 Adult Psychoeducational Group Note  Date:  02/22/2024 Time:  7:39 PM  Group Topic/Focus:  Goals Group:   The focus of this group is to help patients establish daily goals to achieve during treatment and discuss how the patient can incorporate goal setting into their daily lives to aide in recovery. Orientation:   The focus of this group is to educate the patient on the purpose and policies of crisis stabilization and provide a format to answer questions about their admission.  The group details unit policies and expectations of patients while admitted.  Participation Level:  Did Not Attend  Participation Quality:    Affect:    Cognitive:    Insight:   Engagement in Group:    Modes of Intervention:    Additional Comments:    Sheran Lawless 02/22/2024, 7:39 PM

## 2024-02-23 DIAGNOSIS — F3112 Bipolar disorder, current episode manic without psychotic features, moderate: Secondary | ICD-10-CM | POA: Diagnosis not present

## 2024-02-23 MED ORDER — BENZTROPINE MESYLATE 2 MG PO TABS
2.0000 mg | ORAL_TABLET | Freq: Two times a day (BID) | ORAL | Status: DC
  Administered 2024-02-23 – 2024-02-24 (×2): 2 mg via ORAL
  Filled 2024-02-23 (×6): qty 1
  Filled 2024-02-23: qty 2

## 2024-02-23 MED ORDER — BENZTROPINE MESYLATE 1 MG/ML IJ SOLN
2.0000 mg | Freq: Two times a day (BID) | INTRAMUSCULAR | Status: DC
  Filled 2024-02-23 (×4): qty 2
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
## 2024-02-23 NOTE — BHH Group Notes (Addendum)
 Spirituality Group Focus of discussion: Forgiveness and Self-Acceptance  Process: Following theoretical framework of group therapy of Chyrl Civatte and further informed by Rogerian and Relational Cultural Theory approaches, participants invited to name:  >Ways of forgiving self and others >Naming and cultivating self-acceptance and self-compassion >Ways our faith/belief/values support this process >Locate points of resonance among group members/engage the "here and now" >Conclude with grounding/breathwork (group today asked for prayer and this was provided only with consent of all participants)  Observations: Victor [name] was an active participant in the group.  [name] L. [name], M.Div [phone]

## 2024-02-23 NOTE — Group Note (Unsigned)
 Date:  02/23/2024 Time:  8:29 PM  Group Topic/Focus:  Wrap-Up Group:   The focus of this group is to help patients review their daily goal of treatment and discuss progress on daily workbooks.     Participation Level:  {BHH PARTICIPATION ZOXWR:60454}  Participation Quality:  {BHH PARTICIPATION QUALITY:22265}  Affect:  {BHH AFFECT:22266}  Cognitive:  {BHH COGNITIVE:22267}  Insight: {BHH Insight2:20797}  Engagement in Group:  {BHH ENGAGEMENT IN UJWJX:91478}  Modes of Intervention:  {BHH MODES OF INTERVENTION:22269}  Additional Comments:  ***  Scot Dock 02/23/2024, 8:29 PM

## 2024-02-23 NOTE — BHH Group Notes (Signed)
 Adult Psychoeducational Group Note  Date:  02/23/2024 Time:  9:45 AM  Group Topic/Focus:  Goals Group:   The focus of this group is to help patients establish daily goals to achieve during treatment and discuss how the patient can incorporate goal setting into their daily lives to aide in recovery. Orientation:   The focus of this group is to educate the patient on the purpose and policies of crisis stabilization and provide a format to answer questions about their admission.  The group details unit policies and expectations of patients while admitted.  Participation Level:  Did Not Attend    Additional Comments:  Did not attend.  Lucilla Edin 02/23/2024, 9:45 AM
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
## 2024-02-23 NOTE — Plan of Care (Addendum)
 Pt remains medication compliant via PO route. Denies SI, HI, AVH and pain when assessed. Noted to be irritable but without outburst post conversation with provider and CSW about his child custody issue. Noted to be worried /concerns while talking to staff this evening with reference to the movie "The pursuit of happiness". Per pt "I feel like I'm Will Smith in the Pursuit of Happiness. I've tried to do everything I can for my son and now he's taken away from me. I just don't want him to wake up 5-7 years from now wondering where is the man that took care of him. I don't want him to be rape or beaten up with no clothes or food. I know lot of things happen in the foster care system". Emotional support, reassurance and encouragement offered. Pt tolerated meals well. Safety checks maintained at Q 15 minutes intervals without outburst.    Problem: Activity: Goal: Interest or engagement in activities will improve Outcome: Progressing   Problem: Coping: Goal: Ability to verbalize frustrations and anger appropriately will improve Outcome: Progressing   Problem: Safety: Goal: Periods of time without injury will increase Outcome: Progressing

## 2024-02-23 NOTE — Group Note (Signed)
 Recreation Therapy Group Note   Group Topic:Problem Solving  Group Date: 02/23/2024 Start Time: 1023 End Time: 1045 Facilitators: Skye Plamondon-McCall, LRT,CTRS Location: [address] Dayroom   Group Topic: Communication, Team Building, Problem Solving  Goal Area(s) Addresses:  Patient will effectively work with peer towards shared goal.  Patient will identify skills used to make activity successful.  Patient will share challenges and verbalize solution-driven approaches used. Patient will identify how skills used during activity can be used to reach post d/c goals.   Intervention: STEM Activity   Activity: [employer]. Patients were provided the following materials: 5 drinking straws, 5 rubber bands, 5 paper clips, 2 index cards and 2 drinking cups. Using the provided materials patients were asked to build a launching mechanism to launch a ping pong ball across the room, approximately 10 feet. Patients were divided into teams of 3-5. Instructions required all materials be incorporated into the device, functionality of items left to the peer group's discretion.  Education: Pharmacist, community, Scientist, physiological, Air cabin crew, Building control surveyor.   Education Outcome: Acknowledges education/In group clarification offered/Needs additional education.    Affect/Mood: N/A   Participation Level: Did not attend    Clinical Observations/Individualized Feedback:      Plan: Continue to engage patient in RT group sessions 2-3x/week.   Aleeya Veitch-McCall, LRT,CTRS 02/23/2024 1:09 PM

## 2024-02-23 NOTE — Progress Notes (Signed)
   02/23/24 1305  Spiritual Encounters  Type of Visit Initial  Care provided to: Patient  Referral source Chaplain assessment  Spiritual Framework  Presenting Themes Impactful experiences and emotions   I met briefly with Victor [name] prior to Spirituality Group.  Victor [name] debriefed with me his concerns over his son who is non-verbal autistic and possibility of CPS involvement. He also shared of goals for a healthy lifestyle and making healthy choices that he understands as "holistic."  I provided compassionate presence and active listening. I offered empathy and built relationship with Victor [name], affirming the positive choices and invited him to group. Encouraged hopefulness and resisting adoption of narratives that are not yet fact.  Jeremias Broyhill L. [name], M.Div [phone]

## 2024-02-23 NOTE — Care Management Important Message (Signed)
 Patient informed of right to appeal discharge, provided phone number to [facility]. Patient expressed no interest in appealing discharge at this time. CSW will continue to monitor situation.   Read Drivers, LCSWA 02/23/2024

## 2024-02-24 DIAGNOSIS — F3112 Bipolar disorder, current episode manic without psychotic features, moderate: Secondary | ICD-10-CM

## 2024-02-24 LAB — COMPREHENSIVE METABOLIC PANEL
ALT: 25 U/L (ref 0–44)
AST: 21 U/L (ref 15–41)
Albumin: 3.9 g/dL (ref 3.5–5.0)
Alkaline Phosphatase: 70 U/L (ref 38–126)
Anion gap: 6 (ref 5–15)
BUN: 17 mg/dL (ref 6–20)
CO2: 28 mmol/L (ref 22–32)
Calcium: 9 mg/dL (ref 8.9–10.3)
Chloride: 104 mmol/L (ref 98–111)
Creatinine, Ser: 0.63 mg/dL (ref 0.61–1.24)
GFR, Estimated: >60 mL/min (ref >60)
Glucose, Bld: 94 mg/dL (ref 70–99)
Potassium: 4.4 mmol/L (ref 3.5–5.1)
Sodium: 138 mmol/L (ref 135–145)
Total Bilirubin: 0.7 mg/dL (ref 0.0–1.2)
Total Protein: 6.7 g/dL (ref 6.5–8.1)

## 2024-02-24 LAB — VALPROIC ACID LEVEL: Valproic Acid Lvl: 73 ug/mL (ref 50.0–100.0)

## 2024-02-24 MED ORDER — HALOPERIDOL 5 MG PO TABS: 5.0000 mg | ORAL_TABLET | Freq: Two times a day (BID) | ORAL | 0 refills | Status: AC

## 2024-02-24 MED ORDER — BENZTROPINE MESYLATE 2 MG PO TABS
2.0000 mg | ORAL_TABLET | Freq: Two times a day (BID) | ORAL | 0 refills | Status: AC
Start: 2024-02-24 — End: ?

## 2024-02-24 MED ORDER — DIVALPROEX SODIUM 500 MG PO DR TAB: 500.0000 mg | DELAYED_RELEASE_TABLET | Freq: Two times a day (BID) | ORAL | 0 refills | Status: AC

## 2024-02-24 NOTE — Plan of Care (Signed)
   Problem: Activity: Goal: Interest or engagement in activities will improve Outcome: Progressing   Problem: Coping: Goal: Ability to verbalize frustrations and anger appropriately will improve Outcome: Progressing

## 2024-02-24 NOTE — [facility] Suicide Risk Assessment (Signed)
 [facility] Discharge Suicide Risk Assessment  Principal Problem: Bipolar 1 disorder with moderate mania (HCC) Discharge Diagnoses: Principal Problem:   Bipolar 1 disorder with moderate mania (HCC)   Reason for Admission:  Victor [name] is a 43 y.o., male with a past psychiatric history of bipolar disorder. He was placed under IVC by his mother for persecutory hallucinations and delusions about the government. He was evaluated at [facility] ED and admitted to the [facility] for symptomatic and medication management of manic and psychotic behavior.   Hospital Summary  During the patient's hospitalization, patient had extensive initial psychiatric evaluation, and follow-up psychiatric evaluations every day.  Psychiatric diagnoses provided upon initial assessment:  - Bipolar Disorder, Manic Episode  - Cannabis Use    Patient's psychiatric medications were adjusted on admission:  - Started on Haldol 5 mg twice daily for acute mania/psychosis - Started Cogentin 1 mg twice daily for prophylaxis while on antipsychotic - Discontinued Zyprexa 5 mg at bedtime due to patient refusal of medications   During the hospitalization, other adjustments were made to the patient's psychiatric medication regimen:  - Increased Cogentin 2 mg twice daily to improve side effects from antipsychotic  Patient's care was discussed during the interdisciplinary team meeting every day during the hospitalization.  The patient reported having mild side effects of urinary retention and dry mouth to prescribed psychiatric medication. Medications were adjusted prior to discharge and patient reported improvement.   Gradually, patient started adjusting to milieu. The patient was evaluated each day by a clinical provider to ascertain response to treatment. Improvement was noted by the patient's report of decreasing symptoms, improved sleep and appetite, affect, medication tolerance, behavior, and  participation in unit programming.  Patient was asked each day to complete a self inventory noting mood, mental status, pain, new symptoms, anxiety and concerns.    Symptoms were reported as significantly decreased or resolved completely by discharge.   On day of discharge, the patient reports that their mood is stable. The patient denied having suicidal thoughts for more than 48 hours prior to discharge.  Patient denies having homicidal thoughts.  Patient denies having auditory hallucinations.  Patient denies any visual hallucinations or other symptoms of psychosis.   Patient states he has a meeting with DSS on Monday to discuss everything that is going on.  Patient reports yesterday was tough given the news about his son's custody, but he is motivated to meet with them regarding the matter.  Patient reports that he spoke with father yesterday and they were able to have a good conversation. Both were calm and they were able to listen and converse with each other. He reports that things are going better with his mother. Both parents report an improvement in his mood and he is less irritable, agitated or angry. He reports overall improvement in mood while on his current medication regimen and wants to continue taking meds once discharged. This is an improvement in the patients insight. Earlier during the patient's hospitalization, he was against medications and required forced medications for symptom stabilization.  Past several days the patient would willingly come to get his medications. The patient was motivated to continue taking medication and going to follow-up appointments with a goal of continued improvement in mental health.    The patient reports their target psychiatric symptoms of mania w/ irritability, agitation and disorganized thoughts responded well to the psychiatric medications, and the patient reports overall benefit other psychiatric hospitalization. Supportive psychotherapy was provided  to the patient. The patient also participated in regular group therapy while hospitalized. Coping skills, problem solving as well as relaxation therapies were also part of the unit programming.  Labs were reviewed with the patient, and abnormal results were discussed with the patient.  The patient is able to verbalize their individual safety plan to this provider.  # It is recommended to the patient to continue psychiatric medications as prescribed, after discharge from the hospital.    # It is recommended to the patient to follow up with your outpatient psychiatric provider and PCP.  # It was discussed with the patient, the impact of alcohol, drugs, tobacco have been there overall psychiatric and medical wellbeing, and total abstinence from substance use was recommended the patient.ed.  # Prescriptions provided or sent directly to preferred pharmacy at discharge. Patient agreeable to plan. Given opportunity to ask questions. Appears to feel comfortable with discharge.    # In the event of worsening symptoms, the patient is instructed to call the crisis hotline, 911 and or go to the nearest ED for appropriate evaluation and treatment of symptoms. To follow-up with primary care provider for other medical issues, concerns and or health care needs  # Patient was discharged home with a plan to follow up as noted below.  Total Time spent with patient: 20 minutes  Musculoskeletal: Strength & Muscle Tone: within normal limits Gait & Station: normal Patient leans: N/A  Psychiatric Specialty Exam  Presentation  General Appearance: Appropriate for Environment; Casual  Eye Contact:Good  Speech:Clear and Coherent  Speech Volume:Normal  Handedness:Right   Mood and Affect  Mood:Euthymic  Duration of Depression Symptoms: No data recorded Affect:Appropriate; Congruent   Thought Process  Thought Processes:Coherent; Goal Directed; Linear  Descriptions of  Associations:Intact  Orientation:Full (Time, Place and Person)  Thought Content:Logical  History of Schizophrenia/Schizoaffective disorder:No  Duration of Psychotic Symptoms:N/A  Hallucinations:Hallucinations: None  Ideas of Reference:None  Suicidal Thoughts:Suicidal Thoughts: No  Homicidal Thoughts:Homicidal Thoughts: No   Sensorium  Memory:Immediate Good; Recent Good  Judgment:Good  Insight:Good   Executive Functions  Concentration:Good  Attention Span:Good  Recall:Good  Fund of Knowledge:Good  Language:Good   Psychomotor Activity  Psychomotor Activity:Psychomotor Activity: Normal   Assets  Assets:Communication Skills; Desire for Improvement; Physical Health; Housing   Sleep  Sleep:Sleep: Good Number of Hours of Sleep: 8   Physical Exam: Physical Exam Constitutional:      Appearance: Normal appearance.  Eyes:     Conjunctiva/sclera: Conjunctivae normal.  Pulmonary:     Effort: Pulmonary effort is normal.  Musculoskeletal:        General: Normal range of motion.  Neurological:     Mental Status: He is alert and oriented to person, place, and time.  Psychiatric:        Attention and Perception: He does not perceive auditory or visual hallucinations.        Mood and Affect: Mood is not anxious or depressed. Affect is not angry.        Speech: Speech is not rapid and pressured.        Behavior: Behavior is not agitated, aggressive or combative. Behavior is cooperative.        Thought Content: Thought content is not paranoid or delusional. Thought content does not include homicidal or suicidal ideation. Thought content does not include homicidal or suicidal plan.    Review of Systems  Constitutional:  Negative for chills and fever.  Respiratory:  Negative for cough.   Cardiovascular:  Negative for chest  pain.  Gastrointestinal:  Negative for nausea and vomiting.  Neurological:  Negative for headaches.  Psychiatric/Behavioral:  Negative for  depression, hallucinations, memory loss, substance abuse and suicidal ideas. The patient is not nervous/anxious and does not have insomnia.    Blood pressure 115/69, pulse (!) 59, temperature 97.6 F (36.4 C), temperature source Oral, resp. rate 18, height 5\' 7"  (1.702 m), weight 83 kg, SpO2 100%. Body mass index is 28.66 kg/m.  Mental Status Per Nursing Assessment::   On Admission:  NA  Demographic Factors:  Male, Caucasian, and Low socioeconomic status  Loss Factors: NA  Historical Factors: NA  Risk Reduction Factors:   Responsible for children under 60 years of age, Sense of responsibility to family, and Religious beliefs about death  Continued Clinical Symptoms:  Bipolar Disorder:   Mixed State Unstable or Poor Therapeutic Relationship Previous Psychiatric Diagnoses and Treatments  Cognitive Features That Contribute To Risk:  None    Suicide Risk:  Minimal: No identifiable suicidal ideation. Patients presenting with no risk factors but with morbid ruminations; may be classified as minimal risk based on the severity of the depressive symptoms    Follow-up Information     [facility] Go on 02/29/2024.   Why: Please go to this provider for a hospital follow up appointment for therapy and medication management services. Contact information: [address] [place] [address] [phone]                 Plan Of Care/Follow-up recommendations:   Activity: as tolerated  Diet: heart healthy  Other: -Follow-up with your outpatient psychiatric provider -instructions on appointment date, time, and address (location) are provided to you in discharge paperwork.  -Take your psychiatric medications as prescribed at discharge - instructions are provided to you in the discharge paperwork  -Follow-up with outpatient primary care doctor and other specialists -for management of preventative medicine and chronic medical disease  -Testing:  Follow-up with outpatient provider for abnormal lab results:  3/16 Potassium 3.4 3/22 Valproic Acid Level 73, therapeutic range, will need recheck in 1 month   -If you are prescribed an atypical antipsychotic medication, we recommend that your outpatient psychiatrist follow routine screening for side effects within 3 months of discharge, including monitoring: AIMS scale, height, weight, blood pressure, fasting lipid panel, HbA1c, and fasting blood sugar.   -Recommend total abstinence from alcohol, tobacco, and other illicit drug use at discharge.   -If your psychiatric symptoms recur, worsen, or if you have side effects to your psychiatric medications, call your outpatient psychiatric provider, 911, [phone] or go to the nearest emergency department.  -If suicidal thoughts occur, immediately call your outpatient psychiatric provider, 911, [phone] or go to the nearest emergency department.   Signed: [name], MD 02/24/2024, 11:51 AM

## 2024-02-24 NOTE — Progress Notes (Signed)
 Patient calm and cooperative at discharge. Acknowledged all discharge information provided. All belonging accounted for. No issues or distress noted.

## 2024-02-24 NOTE — Progress Notes (Signed)
  [facility] Adult Case Management Discharge Plan :  Will you be returning to the same living situation after discharge:  Yes,  Patient returning home at discharge. At discharge, do you have transportation home?: Yes,  Patient's father will provide transportation. Do you have the ability to pay for your medications: Yes,  Patient reports ability to afford medcations.  Release of information consent forms completed and in the chart;  Patient's signature needed at discharge.  Patient to Follow up at:  Follow-up Information     [facility] Go on 02/29/2024.   Why: Please go to this provider for a hospital follow up appointment for therapy and medication management services. Contact information: [address] [place] [address] [phone]                 Next level of care provider has access to [facility] Link:yes  Safety Planning and Suicide Prevention discussed: Yes,  SPE completed with patient's father Satn [phone]) on 02/22/2024.     Has patient been referred to the Quitline?: Patient refused referral for treatment  Patient has been referred for addiction treatment: No known substance use disorder.  Felecia Shelling Jaylin Roundy, LCSWA 02/24/2024, 9:59 AM

## 2024-02-24 NOTE — Progress Notes (Signed)
   02/23/24 2115  Psych Admission Type (Psych Patients Only)  Admission Status Involuntary  Psychosocial Assessment  Patient Complaints Anxiety  Eye Contact Fair  Facial Expression Anxious  Affect Appropriate to circumstance  Speech Logical/coherent  Interaction Assertive  Motor Activity Pacing;Fidgety  Appearance/Hygiene Unremarkable  Behavior Characteristics Appropriate to situation;Cooperative  Mood Pleasant  Thought Process  Coherency WDL  Content Blaming others  Delusions None reported or observed  Perception WDL  Hallucination None reported or observed  Judgment Impaired  Confusion None  Danger to Self  Current suicidal ideation? Denies  Danger to Others  Danger to Others None reported or observed

## 2024-02-24 NOTE — Discharge Instructions (Signed)

## 2024-02-24 NOTE — Discharge Summary (Addendum)
 Physician Discharge Summary Note Patient:  Victor [name] is an 43 y.o., male MRN:  [id number] DOB:  1981/08/23 Patient phone:  [phone] (home)  Patient address:   [address] [place] [address],  Total Time spent with patient: 20 minutes  Date of Admission:  02/19/2024 Date of Discharge: 02/24/2024  Reason for Admission:   Victor [name] is a 43 y.o., male with a past psychiatric history of bipolar disorder. He was placed under IVC by his mother for persecutory hallucinations and delusions about the government. He was evaluated at [facility] ED and admitted to the [facility] for symptomatic and medication management of manic and psychotic behavior.   Principal Problem: Bipolar 1 disorder with moderate mania (HCC) Discharge Diagnoses: Principal Problem:   Bipolar 1 disorder with moderate mania (HCC)  Past Psychiatric History: (history obtained from patient in manic episode)  Previous psych diagnoses: Bipolar disorder Prior inpatient psychiatric treatment: Patient reports prior admission to behavioral Hospital in [place], with [facility]: Behavioral health hospitalization was back in May 2002, unable to access EMR records.  Prior outpatient psychiatric treatment: [facility]  Current psychiatric provider: Denies   Neuromodulation history: not assessed and denies   Current therapist: Denies Psychotherapy hx: N/A   History of suicide attempts: unable to access History of homicide: unable to access   Psychotropic medications: Current None    Past Seroquel  Past Medical History:  Past Medical History:  Diagnosis Date   Medical history non-contributory     Past Surgical History:  Procedure Laterality Date   ABDOMINAL SURGERY     NO PAST SURGERIES      Family History: (history obtained from patient in manic episode)  Medical: None  Psych: Patient reports depression and schizophrenia in Mom Psych Rx: unable to access Suicide: unable to  access Homicide: unable to access Substance use family hx: unable to access  Social History: (history obtained from patient in manic episode)  Place of birth and grew up where: unable to access Abuse: unable to access Marital Status: unable to access Sexual orientation: straight Children: 60 42.70 year old boy  Employment: unable to access Highest level of education: unable to access Housing: unable to access Finances: unable to access Legal: unable to access Military: unable to access Weapons: unable to access Pills stockpile: unable to access  Hospital Course:   During the patient's hospitalization, patient had extensive initial psychiatric evaluation, and follow-up psychiatric evaluations every day.   Psychiatric diagnoses provided upon initial assessment:  - Bipolar Disorder, Manic Episode  - Cannabis Use     Patient's psychiatric medications were adjusted on admission:  - Started on Haldol 5 mg twice daily for acute mania/psychosis - Started Cogentin 1 mg twice daily for prophylaxis while on antipsychotic - Discontinued Zyprexa 5 mg at bedtime due to patient refusal of medications    During the hospitalization, other adjustments were made to the patient's psychiatric medication regimen:  - Increased Cogentin 2 mg twice daily to improve side effects from antipsychotic   Patient's care was discussed during the interdisciplinary team meeting every day during the hospitalization.   The patient reported having mild side effects of urinary retention and dry mouth to prescribed psychiatric medication. Medications were adjusted prior to discharge and patient reported improvement.    Gradually, patient started adjusting to milieu. The patient was evaluated each day by a clinical provider to ascertain response to treatment. Improvement was noted by the patient's report of decreasing symptoms, improved sleep  and appetite, affect, medication tolerance, behavior, and participation in unit  programming.  Patient was asked each day to complete a self inventory noting mood, mental status, pain, new symptoms, anxiety and concerns.     Symptoms were reported as significantly decreased or resolved completely by discharge.    On day of discharge, the patient reports that their mood is stable. The patient denied having suicidal thoughts for more than 48 hours prior to discharge.  Patient denies having homicidal thoughts.  Patient denies having auditory hallucinations.  Patient denies any visual hallucinations or other symptoms of psychosis.    Patient states he has a meeting with DSS on Monday to discuss everything that is going on.  Patient reports yesterday was tough given the news about his son's custody, but he is motivated to meet with them regarding the matter.  Patient reports that he spoke with father yesterday and they were able to have a good conversation. Both were calm and they were able to listen and converse with each other. He reports that things are going better with his mother. Both parents report an improvement in his mood and he is less irritable, agitated or angry. He reports overall improvement in mood while on his current medication regimen and wants to continue taking meds once discharged. This is an improvement in the patients insight. Earlier during the patient's hospitalization, he was against medications and required forced medications for symptom stabilization.  Past several days the patient would willingly come to get his medications. The patient was motivated to continue taking medication and going to follow-up appointments with a goal of continued improvement in mental health.     The patient reports their target psychiatric symptoms of mania w/ irritability, agitation and disorganized thoughts responded well to the psychiatric medications, and the patient reports overall benefit other psychiatric hospitalization. Supportive psychotherapy was provided to the patient. The  patient also participated in regular group therapy while hospitalized. Coping skills, problem solving as well as relaxation therapies were also part of the unit programming.   Labs were reviewed with the patient, and abnormal results were discussed with the patient.   The patient is able to verbalize their individual safety plan to this provider.   # It is recommended to the patient to continue psychiatric medications as prescribed, after discharge from the hospital.     # It is recommended to the patient to follow up with your outpatient psychiatric provider and PCP.   # It was discussed with the patient, the impact of alcohol, drugs, tobacco have been there overall psychiatric and medical wellbeing, and total abstinence from substance use was recommended the patient.ed.   # Prescriptions provided or sent directly to preferred pharmacy at discharge. Patient agreeable to plan. Given opportunity to ask questions. Appears to feel comfortable with discharge.    # In the event of worsening symptoms, the patient is instructed to call the crisis hotline, 911 and or go to the nearest ED for appropriate evaluation and treatment of symptoms. To follow-up with primary care provider for other medical issues, concerns and or health care needs   # Patient was discharged home with a plan to follow up as noted below.  Physical Findings: AIMS:  , ,  ,  ,    CIWA:    COWS:     Musculoskeletal: Strength & Muscle Tone: within normal limits Gait & Station: normal Patient leans: N/A  Psychiatric Specialty Exam  Presentation  General Appearance: Appropriate for Environment; Casual  Eye Contact:Good  Speech:Clear and Coherent  Speech Volume:Normal  Handedness:Right   Mood and Affect  Mood:Euthymic  Duration of Depression Symptoms: None  Affect:Appropriate; Congruent   Thought Process  Thought Processes:Coherent; Goal Directed; Linear  Descriptions of  Associations:Intact  Orientation:Full (Time, Place and Person)  Thought Content:Logical  History of Schizophrenia/Schizoaffective disorder:No  Duration of Psychotic Symptoms:N/A  Hallucinations:Hallucinations: None  Ideas of Reference:None  Suicidal Thoughts:Suicidal Thoughts: No  Homicidal Thoughts:Homicidal Thoughts: No   Sensorium  Memory:Immediate Good; Recent Good  Judgment:Good  Insight:Good   Executive Functions  Concentration:Good  Attention Span:Good  Recall:Good  Fund of Knowledge:Good  Language:Good   Psychomotor Activity  Psychomotor Activity:Psychomotor Activity: Normal   Assets  Assets:Communication Skills; Desire for Improvement; Physical Health; Housing   Sleep  Sleep:Sleep: Good Number of Hours of Sleep: 8   Physical Exam: Constitutional:      Appearance: Normal appearance.  Eyes:     Conjunctiva/sclera: Conjunctivae normal.  Pulmonary:     Effort: Pulmonary effort is normal.  Musculoskeletal:        General: Normal range of motion.  Neurological:     Mental Status: He is alert and oriented to person, place, and time.  Psychiatric:        Attention and Perception: He does not perceive auditory or visual hallucinations.        Mood and Affect: Mood is not anxious or depressed. Affect is not angry.        Speech: Speech is not rapid and pressured.        Behavior: Behavior is not agitated, aggressive or combative. Behavior is cooperative.        Thought Content: Thought content is not paranoid or delusional. Thought content does not include homicidal or suicidal ideation. Thought content does not include homicidal or suicidal plan.      Review of Systems  Constitutional:  Negative for chills and fever.  Respiratory:  Negative for cough.   Cardiovascular:  Negative for chest pain.  Gastrointestinal:  Negative for nausea and vomiting.  Neurological:  Negative for headaches.  Psychiatric/Behavioral:  Negative for depression,  hallucinations, memory loss, substance abuse and suicidal ideas. The patient is not nervous/anxious and does not have insomnia.   Blood pressure 115/69, pulse (!) 59, temperature 97.6 F (36.4 C), temperature source Oral, resp. rate 18, height 5\' 7"  (1.702 m), weight 83 kg, SpO2 100%. Body mass index is 28.66 kg/m.  Social History   Tobacco Use  Smoking Status Every Day  Smokeless Tobacco Never   Tobacco Cessation:  N/A, patient does not currently use tobacco products  Blood Alcohol level:  Lab Results  Component Value Date   ETH <10 02/18/2024   ETH <10 11/25/2022    Metabolic Disorder Labs:  No results found for: "HGBA1C", "MPG" No results found for: "PROLACTIN" No results found for: "CHOL", "TRIG", "HDL", "CHOLHDL", "VLDL", "LDLCALC"  See Psychiatric Specialty Exam and Suicide Risk Assessment completed by Attending Physician prior to discharge.  Discharge destination:  Home  Is patient on multiple antipsychotic therapies at discharge:  No   Has Patient had three or more failed trials of antipsychotic monotherapy by history:  No  Recommended Plan for Multiple Antipsychotic Therapies: NA   Allergies as of 02/24/2024       Reactions   Amoxicillin Nausea And Vomiting   Penicillin G Nausea Only, Nausea And Vomiting        Medication List     TAKE these medications      Indication  benztropine 2 MG tablet Commonly known as: COGENTIN Take 1 tablet (2 mg total) by mouth 2 (two) times daily.  Indication: Extrapyramidal Reaction caused by Medications   divalproex 500 MG DR tablet Commonly known as: DEPAKOTE Take 1 tablet (500 mg total) by mouth every 12 (twelve) hours.  Indication: Manic Phase of Manic-Depression   haloperidol 5 MG tablet Commonly known as: HALDOL Take 1 tablet (5 mg total) by mouth 2 (two) times daily.  Indication: Manic Phase of Manic-Depression        Follow-up Information     [facility] Go on 02/29/2024.   Why:  Please go to this provider for a hospital follow up appointment for therapy and medication management services. Contact information: [address] [place] [address] [phone]                 Follow-up recommendations / Comments: Activity: as tolerated   Diet: heart healthy   Other: -Follow-up with your outpatient psychiatric provider -instructions on appointment date, time, and address (location) are provided to you in discharge paperwork.   -Take your psychiatric medications as prescribed at discharge - instructions are provided to you in the discharge paperwork   -Follow-up with outpatient primary care doctor and other specialists -for management of preventative medicine and chronic medical disease   -Testing: Follow-up with outpatient provider for abnormal lab results:  3/16 Potassium 3.4 3/22 Valproic Acid Level 73, therapeutic range, will need recheck in 1 month    -If you are prescribed an atypical antipsychotic medication, we recommend that your outpatient psychiatrist follow routine screening for side effects within 3 months of discharge, including monitoring: AIMS scale, height, weight, blood pressure, fasting lipid panel, HbA1c, and fasting blood sugar.    -Recommend total abstinence from alcohol, tobacco, and other illicit drug use at discharge.    -If your psychiatric symptoms recur, worsen, or if you have side effects to your psychiatric medications, call your outpatient psychiatric provider, 911, [phone] or go to the nearest emergency department.   -If suicidal thoughts occur, immediately call your outpatient psychiatric provider, 911, [phone] or go to the nearest emergency department.     Signed: [name], MD 02/24/2024, 10:23 AM

## 2024-02-24 NOTE — Plan of Care (Signed)
   Problem: Education: Goal: Emotional status will improve Outcome: Progressing Goal: Mental status will improve Outcome: Progressing

## 2024-02-24 NOTE — Progress Notes (Signed)
   02/24/24 0900  Psych Admission Type (Psych Patients Only)  Admission Status Involuntary  Psychosocial Assessment  Patient Complaints None  Eye Contact Fair  Facial Expression Anxious  Affect Appropriate to circumstance  Speech Logical/coherent  Interaction Assertive  Motor Activity Fidgety  Appearance/Hygiene Unremarkable  Behavior Characteristics Appropriate to situation;Cooperative  Mood Pleasant  Thought Process  Coherency WDL  Content Blaming others  Delusions None reported or observed  Perception WDL  Hallucination None reported or observed  Judgment Limited  Confusion None  Danger to Self  Current suicidal ideation? Denies  Danger to Others  Danger to Others None reported or observed

## 2024-12-04 ENCOUNTER — Ambulatory Visit: Attending: Physician Assistant
